# Patient Record
Sex: Female | Born: 1950
Health system: Southern US, Community
[De-identification: ages and names within clinical notes are randomized; demographics above are authoritative.]

## PROBLEM LIST (undated history)

## (undated) DIAGNOSIS — D509 Iron deficiency anemia, unspecified: Secondary | ICD-10-CM

## (undated) DIAGNOSIS — I10 Essential (primary) hypertension: Secondary | ICD-10-CM

## (undated) DIAGNOSIS — E669 Obesity, unspecified: Secondary | ICD-10-CM

## (undated) DIAGNOSIS — E1169 Type 2 diabetes mellitus with other specified complication: Secondary | ICD-10-CM

## (undated) DIAGNOSIS — I2510 Atherosclerotic heart disease of native coronary artery without angina pectoris: Secondary | ICD-10-CM

## (undated) DIAGNOSIS — Z87891 Personal history of nicotine dependence: Secondary | ICD-10-CM

## (undated) DIAGNOSIS — E785 Hyperlipidemia, unspecified: Secondary | ICD-10-CM

## (undated) HISTORY — DX: Iron deficiency anemia, unspecified: D50.9

## (undated) HISTORY — DX: Hyperlipidemia, unspecified: E78.5

## (undated) HISTORY — DX: Atherosclerotic heart disease of native coronary artery without angina pectoris: I25.10

## (undated) HISTORY — DX: Personal history of nicotine dependence: Z87.891

## (undated) HISTORY — DX: Obesity, unspecified: E66.9

## (undated) HISTORY — DX: Obesity, unspecified: E11.69

---

## 1998-04-03 ENCOUNTER — Emergency Department (HOSPITAL_COMMUNITY): Admission: EM | Admit: 1998-04-03 | Discharge: 1998-04-03 | Payer: Self-pay | Admitting: Emergency Medicine

## 1999-02-22 ENCOUNTER — Emergency Department (HOSPITAL_COMMUNITY): Admission: EM | Admit: 1999-02-22 | Discharge: 1999-02-22 | Payer: Self-pay | Admitting: Internal Medicine

## 1999-04-22 ENCOUNTER — Ambulatory Visit (HOSPITAL_COMMUNITY): Admission: RE | Admit: 1999-04-22 | Discharge: 1999-04-22 | Payer: Self-pay | Admitting: *Deleted

## 1999-06-30 ENCOUNTER — Emergency Department (HOSPITAL_COMMUNITY): Admission: EM | Admit: 1999-06-30 | Discharge: 1999-07-01 | Payer: Self-pay | Admitting: Emergency Medicine

## 1999-07-19 ENCOUNTER — Ambulatory Visit (HOSPITAL_COMMUNITY): Admission: RE | Admit: 1999-07-19 | Discharge: 1999-07-19 | Payer: Self-pay | Admitting: Gastroenterology

## 1999-07-19 ENCOUNTER — Encounter (INDEPENDENT_AMBULATORY_CARE_PROVIDER_SITE_OTHER): Payer: Self-pay | Admitting: Specialist

## 2000-03-19 ENCOUNTER — Emergency Department (HOSPITAL_COMMUNITY): Admission: EM | Admit: 2000-03-19 | Discharge: 2000-03-19 | Payer: Self-pay | Admitting: Emergency Medicine

## 2000-03-19 ENCOUNTER — Encounter: Payer: Self-pay | Admitting: Emergency Medicine

## 2000-12-07 ENCOUNTER — Other Ambulatory Visit: Admission: RE | Admit: 2000-12-07 | Discharge: 2000-12-07 | Payer: Self-pay | Admitting: Obstetrics and Gynecology

## 2002-01-09 ENCOUNTER — Other Ambulatory Visit: Admission: RE | Admit: 2002-01-09 | Discharge: 2002-01-09 | Payer: Self-pay | Admitting: Obstetrics and Gynecology

## 2003-03-25 ENCOUNTER — Other Ambulatory Visit: Admission: RE | Admit: 2003-03-25 | Discharge: 2003-03-25 | Payer: Self-pay | Admitting: Obstetrics and Gynecology

## 2004-08-24 ENCOUNTER — Other Ambulatory Visit: Admission: RE | Admit: 2004-08-24 | Discharge: 2004-08-24 | Payer: Self-pay | Admitting: Obstetrics and Gynecology

## 2005-03-18 ENCOUNTER — Ambulatory Visit (HOSPITAL_COMMUNITY): Admission: RE | Admit: 2005-03-18 | Discharge: 2005-03-18 | Payer: Self-pay | Admitting: Obstetrics and Gynecology

## 2005-03-18 ENCOUNTER — Encounter (INDEPENDENT_AMBULATORY_CARE_PROVIDER_SITE_OTHER): Payer: Self-pay | Admitting: *Deleted

## 2005-10-06 ENCOUNTER — Encounter: Admission: RE | Admit: 2005-10-06 | Discharge: 2005-10-06 | Payer: Self-pay | Admitting: Family Medicine

## 2006-05-03 ENCOUNTER — Emergency Department (HOSPITAL_COMMUNITY): Admission: EM | Admit: 2006-05-03 | Discharge: 2006-05-03 | Payer: Self-pay | Admitting: Emergency Medicine

## 2009-03-28 ENCOUNTER — Emergency Department (HOSPITAL_COMMUNITY): Admission: EM | Admit: 2009-03-28 | Discharge: 2009-03-28 | Payer: Self-pay | Admitting: Emergency Medicine

## 2010-08-25 LAB — DIFFERENTIAL
Basophils Absolute: 0 10*3/uL (ref 0.0–0.1)
Basophils Relative: 0 % (ref 0–1)
Eosinophils Absolute: 0.1 10*3/uL (ref 0.0–0.7)
Eosinophils Relative: 1 % (ref 0–5)
Lymphocytes Relative: 16 % (ref 12–46)
Lymphs Abs: 1.3 10*3/uL (ref 0.7–4.0)
Monocytes Absolute: 0.3 10*3/uL (ref 0.1–1.0)
Monocytes Relative: 4 % (ref 3–12)
Neutro Abs: 6.5 10*3/uL (ref 1.7–7.7)
Neutrophils Relative %: 79 % — ABNORMAL HIGH (ref 43–77)

## 2010-08-25 LAB — CBC
HCT: 38.3 % (ref 36.0–46.0)
Hemoglobin: 12.2 g/dL (ref 12.0–15.0)
MCHC: 32 g/dL (ref 30.0–36.0)
MCV: 76.1 fL — ABNORMAL LOW (ref 78.0–100.0)
Platelets: 267 10*3/uL (ref 150–400)
RBC: 5.03 MIL/uL (ref 3.87–5.11)
RDW: 15.4 % (ref 11.5–15.5)
WBC: 8.3 10*3/uL (ref 4.0–10.5)

## 2010-08-25 LAB — HEMOCCULT GUIAC POC 1CARD (OFFICE): Fecal Occult Bld: NEGATIVE

## 2010-10-08 NOTE — H&P (Signed)
NAMESANAIYA, Maureen Bishop               ACCOUNT NO.:  [id number]   MEDICAL RECORD NO.:  [id number]          PATIENT TYPE:  AMB   LOCATION:  SDC                           FACILITY:  [facility]   PHYSICIAN:  Janine Limbo, M.D.DATE OF BIRTH:  07-Oct-1950   DATE OF ADMISSION:  03/18/2005  DATE OF DISCHARGE:                                HISTORY & PHYSICAL   HISTORY OF PRESENT ILLNESS:  Maureen Bishop is a 60 year old female, para 2-0-2-  2, who presents for dilatation and curettage.  The patient has a history of  fibroids and post menopausal bleeding.  An endometrial biopsy was attempted  in the office but was unsuccessful because of a stenotic cervix.  She was  found to have benign endocervical glands however.   OBSTETRICAL HISTORY:  1.  The patient had a vaginal delivery at term in 1993.  2.  Vaginal delivery at term in 1971.  3.  She had elective pregnancy terminations in 1988 and again in 1990.   DRUG ALLERGIES:  SULFA MEDICATIONS cause a rash.   PAST MEDICAL HISTORY:  The patient has hypertension and she currently takes  antihypertensives.   REVIEW OF SYSTEMS:  Noncontributory.   FAMILY HISTORY:  The patient's mother has hypertension.  The patient's  mother, father, brothers, and other relatives have diabetes.   PHYSICAL EXAMINATION:  VITAL SIGNS:  Weight is 154 pounds, height is 5 feet.  HEENT:  Within normal limits.  CHEST:  Clear.  HEART:  Regular rate and rhythm.  BREASTS:  Without masses.  ABDOMEN:  Slightly obese but no masses are appreciated.  EXTREMITIES:  Within normal limits.  NEUROLOGIC:  Grossly normal.  PELVIC:  External genitalia is normal.  The vagina is normal.  The cervix  descends one third of the length of the vagina.  The uterus is upper limits  normal size.  Adnexa no masses.  Rectovaginal exam confirms.   ASSESSMENT:  1.  Post menopausal bleeding.  2.  Stenotic cervix.   PLAN:  The patient will undergo hysteroscopy with dilatation and curettage.  She  understands the indications for her surgical procedure, and she accepts  the risk of, but not limited to, anesthetic complications, bleeding,  infections, and possible damage to the surrounding organs.      Janine Limbo, M.D.  Electronically Signed     AVS/MEDQ  D:  03/17/2005  T:  03/17/2005  Job:  [id number]

## 2010-10-08 NOTE — Op Note (Signed)
[name], Maureen Bishop               ACCOUNT NO.:  [id number]   MEDICAL RECORD NO.:  [id number]          PATIENT TYPE:  AMB   LOCATION:  SDC                           FACILITY:  [facility]   PHYSICIAN:  Janine Limbo, M.D.DATE OF BIRTH:  1951/04/30   DATE OF PROCEDURE:  03/18/2005  DATE OF DISCHARGE:                                 OPERATIVE REPORT   PREOPERATIVE DIAGNOSES:  1.  Postmenopausal bleeding.  2.  Stenotic cervix.   POSTOPERATIVE DIAGNOSES:  1.  Postmenopausal bleeding.  2.  Stenotic cervix.   PROCEDURE:  1.  Hysteroscopy.  2.  Dilatation and curettage.   SURGEON:  Dr. Leonard Schwartz.   ANESTHETIC:  Monitored anesthetic control and local 0.5% Marcaine with  epinephrine.   DISPOSITION:  [name] is a 60 year old female, para 2-0-2-2, who presents  with the above-mentioned diagnosis.  The patient understands the indications  for her surgical procedure, and she accepts the risk of, but not limited to,  anesthetic complications, bleeding, infections, and possible damage to the  surrounding organs.   FINDINGS:  The uterus was approximately 8 weeks' size and anterior.  No  adnexal masses were appreciated.  The cervix was noted to be stenotic.  On  hysteroscopy, no lesions were noted within the endometrial cavity.  The  uterus sounded to 10 cm.   PROCEDURE:  The patient was taken to the operating room where she was given  medication through her IV line.  The patient's abdomen, perineum, and vagina  were prepped with multiple layers of Betadine.  The bladder was drained of  urine.  Examination under anesthesia was performed.  The patient was  sterilely draped.  A paracervical block was placed using 10 mL of 0.5%  Marcaine with epinephrine.  An additional 10 mL of 0.5% Marcaine with  epinephrine were injected directly into the cervix.  An endocervical  curettage was then performed.  The uterus was sounded to 10 cm.  The cervix  was gradually dilated.  The  diagnostic hysteroscope was inserted, and the  cavity was carefully inspected.  Pictures were taken.  No lesions were  noted.  The hysteroscope was removed, and the cavity was then sharply  curetted using a small sharp curette.  The cavity was felt to be clean at  the end of our procedure.  The hysteroscope was again inserted, and pictures  were once again taken.  Hemostasis was noted to be adequate.  We felt we  were ready to end our procedure.  All instruments were removed.  The exam  was repeated, and the uterus was noted to be firm.  The patient was awakened  from her anesthetic and taken to the recovery room in stable condition.   FOLLOW-UP INSTRUCTIONS:  The patient was given a prescription for Vicodin,  and she will take 1-2 tablets every 4 hours as needed for pain.  She will  return to see Dr. Stefano Gaul in 2-3 weeks for a follow-up examination.  She  was given a copy of the postoperative instruction sheet as prepared by the  [facility] [place] for  patients who have undergone a dilatation  and curettage.  The patient understands that she can call at any time for  questions or concerns.      Janine Limbo, M.D.  Electronically Signed     AVS/MEDQ  D:  03/18/2005  T:  03/18/2005  Job:  [id number]

## 2011-01-10 ENCOUNTER — Ambulatory Visit
Admission: RE | Admit: 2011-01-10 | Discharge: 2011-01-10 | Disposition: A | Discharge status: Home / Self Care | Payer: BC Managed Care – PPO | Source: Ambulatory Visit | Attending: Family Medicine | Admitting: Family Medicine

## 2011-01-10 ENCOUNTER — Other Ambulatory Visit: Payer: Self-pay | Admitting: Family Medicine

## 2011-01-10 DIAGNOSIS — M1250 Traumatic arthropathy, unspecified site: Secondary | ICD-10-CM

## 2011-07-20 ENCOUNTER — Ambulatory Visit: Payer: Self-pay | Admitting: Obstetrics and Gynecology

## 2011-08-18 ENCOUNTER — Ambulatory Visit (INDEPENDENT_AMBULATORY_CARE_PROVIDER_SITE_OTHER): Payer: BC Managed Care – PPO | Admitting: Obstetrics and Gynecology

## 2011-08-18 DIAGNOSIS — N390 Urinary tract infection, site not specified: Secondary | ICD-10-CM

## 2011-08-18 DIAGNOSIS — Z01419 Encounter for gynecological examination (general) (routine) without abnormal findings: Secondary | ICD-10-CM

## 2011-08-22 ENCOUNTER — Encounter (HOSPITAL_COMMUNITY): Payer: Self-pay | Admitting: *Deleted

## 2011-08-22 ENCOUNTER — Emergency Department (HOSPITAL_COMMUNITY)
Admission: EM | Admit: 2011-08-22 | Discharge: 2011-08-22 | Disposition: A | Discharge status: Home / Self Care | Payer: BC Managed Care – PPO | Attending: Emergency Medicine | Admitting: Emergency Medicine

## 2011-08-22 DIAGNOSIS — M549 Dorsalgia, unspecified: Secondary | ICD-10-CM | POA: Insufficient documentation

## 2011-08-22 DIAGNOSIS — I10 Essential (primary) hypertension: Secondary | ICD-10-CM | POA: Insufficient documentation

## 2011-08-22 DIAGNOSIS — N390 Urinary tract infection, site not specified: Secondary | ICD-10-CM | POA: Insufficient documentation

## 2011-08-22 DIAGNOSIS — R109 Unspecified abdominal pain: Secondary | ICD-10-CM | POA: Insufficient documentation

## 2011-08-22 HISTORY — DX: Essential (primary) hypertension: I10

## 2011-08-22 LAB — COMPREHENSIVE METABOLIC PANEL
ALT: 37 U/L — ABNORMAL HIGH (ref 0–35)
Calcium: 9.6 mg/dL (ref 8.4–10.5)
Creatinine, Ser: 0.82 mg/dL (ref 0.50–1.10)
GFR calc Af Amer: 87 mL/min — ABNORMAL LOW (ref >90)
GFR calc non Af Amer: 75 mL/min — ABNORMAL LOW (ref >90)
Glucose, Bld: 106 mg/dL — ABNORMAL HIGH (ref 70–99)
Sodium: 137 mEq/L (ref 135–145)
Total Protein: 7.7 g/dL (ref 6.0–8.3)

## 2011-08-22 LAB — URINALYSIS, ROUTINE W REFLEX MICROSCOPIC
Bilirubin Urine: NEGATIVE
Bilirubin Urine: NEGATIVE
Hgb urine dipstick: NEGATIVE
Ketones, ur: NEGATIVE mg/dL
Nitrite: NEGATIVE
Protein, ur: NEGATIVE mg/dL
Urobilinogen, UA: 0.2 mg/dL (ref 0.0–1.0)
pH: 5 (ref 5.0–8.0)

## 2011-08-22 LAB — CBC
HCT: 36.6 % (ref 36.0–46.0)
MCH: 23.4 pg — ABNORMAL LOW (ref 26.0–34.0)
MCV: 72.6 fL — ABNORMAL LOW (ref 78.0–100.0)
Platelets: 292 10*3/uL (ref 150–400)
RBC: 5.04 MIL/uL (ref 3.87–5.11)
RDW: 15.5 % (ref 11.5–15.5)

## 2011-08-22 LAB — URINE MICROSCOPIC-ADD ON

## 2011-08-22 LAB — DIFFERENTIAL
Eosinophils Absolute: 0.1 10*3/uL (ref 0.0–0.7)
Eosinophils Relative: 1 % (ref 0–5)
Lymphs Abs: 1.9 10*3/uL (ref 0.7–4.0)
Monocytes Absolute: 0.6 10*3/uL (ref 0.1–1.0)

## 2011-08-22 MED ORDER — NITROFURANTOIN MONOHYD MACRO 100 MG PO CAPS: 100.0000 mg | ORAL_CAPSULE | Freq: Two times a day (BID) | ORAL | Status: AC

## 2011-08-22 MED ORDER — HYDROCODONE-ACETAMINOPHEN 5-500 MG PO TABS: 1.0000 | ORAL_TABLET | Freq: Four times a day (QID) | ORAL | Status: AC | PRN

## 2011-08-22 MED ORDER — CYCLOBENZAPRINE HCL 10 MG PO TABS
10.0000 mg | ORAL_TABLET | Freq: Once | ORAL | Status: AC
  Administered 2011-08-22: 10 mg via ORAL
  Filled 2011-08-22: qty 1

## 2011-08-22 MED ORDER — KETOROLAC TROMETHAMINE 60 MG/2ML IM SOLN
60.0000 mg | Freq: Once | INTRAMUSCULAR | Status: AC
  Administered 2011-08-22: 60 mg via INTRAMUSCULAR
  Filled 2011-08-22: qty 2

## 2011-08-22 NOTE — ED Notes (Signed)
Pt indicates having right flank pain, states "I have UTI's quite often"

## 2011-08-22 NOTE — ED Provider Notes (Addendum)
History     CSN: [id number]  Arrival date & time 08/22/11  5621   First MD Initiated Contact with Patient 08/22/11 [phone]      Chief Complaint  Patient presents with  . Back Pain    (Consider location/radiation/quality/duration/timing/severity/associated sxs/prior treatment) Patient is a 62 y.o. female presenting with back pain. The history is provided by the patient.  Back Pain  This is a new problem. The current episode started yesterday. The problem occurs constantly. The problem has been gradually worsening. The pain is associated with no known injury (Exercise multiple times last week which is new and was very busy on Saturday). The pain is present in the lumbar spine. The quality of the pain is described as stabbing and shooting. The pain does not radiate. The pain is at a severity of 8/10. The pain is severe. The symptoms are aggravated by twisting, bending and certain positions. The pain is the same all the time. Stiffness is present all day. Pertinent negatives include no fever, no abdominal pain, no abdominal swelling, no bowel incontinence, no bladder incontinence, no dysuria, no pelvic pain, no leg pain, no paresthesias and no weakness. Treatments tried: Tylenol. The treatment provided no relief.    Past Medical History  Diagnosis Date  . Hypertension     Past Surgical History  Procedure Date  . Cesarean section     No family history on file.  History  Substance Use Topics  . Smoking status: Never Smoker   . Smokeless tobacco: Not on file  . Alcohol Use: 6.0 oz/week    10 Glasses of wine per week    OB History    Grav Para Term Preterm Abortions TAB SAB Ect Mult Living                  Review of Systems  Constitutional: Negative for fever.  Gastrointestinal: Negative for abdominal pain and bowel incontinence.  Genitourinary: Negative for bladder incontinence, dysuria and pelvic pain.  Musculoskeletal: Positive for back pain.  Neurological: Negative for  weakness and paresthesias.  All other systems reviewed and are negative.    Allergies  Sulfa antibiotics  Home Medications   Current Outpatient Rx  Name Route Sig Dispense Refill  . ACETAMINOPHEN 500 MG PO TABS Oral Take 1,000 mg by mouth every 4 (four) hours as needed. pain    . OLMESARTAN-AMLODIPINE-HCTZ 20-5-12.5 MG PO TABS Oral Take 1 tablet by mouth daily.      BP 139/68  Pulse 75  Temp(Src) 98.2 F (36.8 C) (Oral)  Resp 20  Wt 160 lb (72.576 kg)  SpO2 100%  Physical Exam  Nursing note and vitals reviewed. Constitutional: She is oriented to person, place, and time. She appears well-developed and well-nourished. She appears distressed.  HENT:  Head: Normocephalic and atraumatic.  Eyes: EOM are normal. Pupils are equal, round, and reactive to light.  Cardiovascular: Intact distal pulses.   Abdominal: Soft. Bowel sounds are normal. She exhibits no distension. There is no tenderness. There is no rebound, no guarding and no CVA tenderness.  Musculoskeletal: She exhibits no tenderness.       Lumbar back: She exhibits decreased range of motion, tenderness and spasm.       Back:       No edema  Neurological: She is alert and oriented to person, place, and time. No cranial nerve deficit.  Skin: Skin is warm and dry. No rash noted.  Psychiatric: She has a normal mood and affect. Her behavior is  normal.    ED Course  Procedures (including critical care time)  Labs Reviewed  URINALYSIS, ROUTINE W REFLEX MICROSCOPIC - Abnormal; Notable for the following:    APPearance CLOUDY (*)    Leukocytes, UA MODERATE (*)    All other components within normal limits  GLUCOSE, CAPILLARY - Abnormal; Notable for the following:    Glucose-Capillary 115 (*)    All other components within normal limits  URINE MICROSCOPIC-ADD ON - Abnormal; Notable for the following:    Squamous Epithelial / LPF MANY (*)    Bacteria, UA MANY (*)    All other components within normal limits  URINALYSIS,  ROUTINE W REFLEX MICROSCOPIC - Abnormal; Notable for the following:    APPearance CLOUDY (*)    Hgb urine dipstick TRACE (*)    Leukocytes, UA MODERATE (*)    All other components within normal limits  CBC - Abnormal; Notable for the following:    Hemoglobin 11.8 (*)    MCV 72.6 (*)    MCH 23.4 (*)    All other components within normal limits  COMPREHENSIVE METABOLIC PANEL - Abnormal; Notable for the following:    Glucose, Bld 106 (*)    ALT 37 (*)    GFR calc non Af Amer 75 (*)    GFR calc Af Amer 87 (*)    All other components within normal limits  URINE MICROSCOPIC-ADD ON - Abnormal; Notable for the following:    Squamous Epithelial / LPF FEW (*)    Bacteria, UA FEW (*)    All other components within normal limits  DIFFERENTIAL   No results found.   1. UTI (lower urinary tract infection)   2. Flank pain       MDM   Patient with back pain most suggestive of right lumbar spasm. She states last week she had done multiple days of exercise and had been very busy and yesterday started feeling spasm on the right side of her back. It is worse when she bends, twists and moves. It is reproducible with palpation on the right side but has no symptoms suggestive of a radicular component.  She denies urinary incontinence, frequency, urgency, dysuria, nausea, vomiting, fever. She states she gets 2-3 urinary tract infections a year her last one was 3 months ago. She denies any other medical problems other than hypertension but states she does have a strong family history of diabetes.  Feel symptoms are all musculoskeletal in nature and patient is having muscle spasms. Doubt pyelonephritis at this time given her symptoms and physical exam however will check a UA and a fingerstick blood sugar.  No symptoms suggestive of kidney stone.   10:02 AM Patient with borderline blood sugar of 1:15 the patient states she has been fasting. UA appears contaminated with many epithelial cells however there  is also many bacteria 21-50 white blood cells and moderate leukocytes. Will attempt to get a cleaner sample.  11:01 AM Labs within normal limits. Repeat urine shows signs of infection and culture was sent. Antibiotics given and pain control given.  Gwyneth Sprout, MD 08/22/11 1105  Gwyneth Sprout, MD 08/22/11 1108  Gwyneth Sprout, MD 08/22/11 1108

## 2012-03-20 ENCOUNTER — Ambulatory Visit
Admission: RE | Admit: 2012-03-20 | Discharge: 2012-03-20 | Disposition: A | Discharge status: Home / Self Care | Payer: BC Managed Care – PPO | Source: Ambulatory Visit | Attending: Family Medicine | Admitting: Family Medicine

## 2012-03-20 ENCOUNTER — Other Ambulatory Visit: Payer: Self-pay | Admitting: Family Medicine

## 2012-03-20 DIAGNOSIS — M7730 Calcaneal spur, unspecified foot: Secondary | ICD-10-CM

## 2012-05-31 ENCOUNTER — Telehealth: Payer: Self-pay | Admitting: Obstetrics and Gynecology

## 2012-05-31 NOTE — Telephone Encounter (Signed)
Spoke with pt rgd msg pt c/o lump in right breast and tender for 3 weeks pt has appt 06/07/12 at 3;00 with avs pt voice understanding

## 2012-06-07 ENCOUNTER — Ambulatory Visit: Payer: BC Managed Care – PPO | Admitting: Obstetrics and Gynecology

## 2012-06-07 ENCOUNTER — Encounter: Payer: Self-pay | Admitting: Obstetrics and Gynecology

## 2012-06-07 VITALS — BP 118/60 | Temp 98.0°F | Wt 164.0 lb

## 2012-06-07 DIAGNOSIS — N63 Unspecified lump in unspecified breast: Secondary | ICD-10-CM

## 2012-06-07 NOTE — Progress Notes (Signed)
HISTORY OF PRESENT ILLNESS  [name] is a 62 y.o. year old female,G2P2, who presents for a problem visit. The patient had a normal mammogram in March of 2013.  She had fibroglandular densities.  Her sister has breast cancer.  Her first period was at age 18.  Her first child was born at age 34.  She is a former cigarette smoker.  She denies estrogen replacement therapy.  Subjective:  The patient complains of a tender lump in her right breast for 2 months.  She denies breast discharge and bleeding.  Objective:  BP 118/60  Temp 98 F (36.7 C) (Oral)  Wt 164 lb (74.39 kg)   breast exam: Tenderness in the right upper area just [place] of the nipple.  No masses appreciated.  No discharge or skin changes present. Chest: Clear Heart: Regular rate and rhythm No adenopathy present.  Exam deferred.  Assessment:  Right breast pain. Sister with breast cancer.  Plan:  Mammogram.  Return to office prn if symptoms worsen or fail to improve.  Return in 6-7 weeks for repeat exam and for annual exam.   Leonard Schwartz M.D.  06/07/2012 3:53 PM   Pt c/o lump in right breast x 2 months.

## 2012-06-22 ENCOUNTER — Other Ambulatory Visit: Payer: Self-pay

## 2012-06-22 DIAGNOSIS — N644 Mastodynia: Secondary | ICD-10-CM

## 2012-11-01 ENCOUNTER — Emergency Department (HOSPITAL_COMMUNITY)
Admission: EM | Admit: 2012-11-01 | Discharge: 2012-11-01 | Disposition: A | Discharge status: Home / Self Care | Payer: BC Managed Care – PPO | Attending: Emergency Medicine | Admitting: Emergency Medicine

## 2012-11-01 ENCOUNTER — Encounter (HOSPITAL_COMMUNITY): Payer: Self-pay | Admitting: Emergency Medicine

## 2012-11-01 DIAGNOSIS — K625 Hemorrhage of anus and rectum: Secondary | ICD-10-CM | POA: Insufficient documentation

## 2012-11-01 DIAGNOSIS — K602 Anal fissure, unspecified: Secondary | ICD-10-CM | POA: Insufficient documentation

## 2012-11-01 DIAGNOSIS — I10 Essential (primary) hypertension: Secondary | ICD-10-CM | POA: Insufficient documentation

## 2012-11-01 DIAGNOSIS — Z792 Long term (current) use of antibiotics: Secondary | ICD-10-CM | POA: Insufficient documentation

## 2012-11-01 DIAGNOSIS — K921 Melena: Secondary | ICD-10-CM | POA: Insufficient documentation

## 2012-11-01 DIAGNOSIS — K649 Unspecified hemorrhoids: Secondary | ICD-10-CM | POA: Insufficient documentation

## 2012-11-01 LAB — SAMPLE TO BLOOD BANK

## 2012-11-01 LAB — CBC WITH DIFFERENTIAL/PLATELET
Basophils Relative: 0 % (ref 0–1)
Eosinophils Absolute: 0.1 10*3/uL (ref 0.0–0.7)
Eosinophils Relative: 1 % (ref 0–5)
MCH: 23.5 pg — ABNORMAL LOW (ref 26.0–34.0)
MCHC: 32.8 g/dL (ref 30.0–36.0)
MCV: 71.7 fL — ABNORMAL LOW (ref 78.0–100.0)
Neutrophils Relative %: 60 % (ref 43–77)
Platelets: 282 10*3/uL (ref 150–400)
RDW: 15.3 % (ref 11.5–15.5)

## 2012-11-01 LAB — COMPREHENSIVE METABOLIC PANEL
ALT: 26 U/L (ref 0–35)
Albumin: 3.9 g/dL (ref 3.5–5.2)
Alkaline Phosphatase: 80 U/L (ref 39–117)
Calcium: 9.3 mg/dL (ref 8.4–10.5)
GFR calc Af Amer: >90 mL/min (ref >90)
Potassium: 3.6 mEq/L (ref 3.5–5.1)
Sodium: 138 mEq/L (ref 135–145)
Total Protein: 8 g/dL (ref 6.0–8.3)

## 2012-11-01 LAB — PROTIME-INR: INR: 1 (ref 0.00–1.49)

## 2012-11-01 LAB — APTT: aPTT: 31 seconds (ref 24–37)

## 2012-11-01 NOTE — ED Notes (Signed)
Pt c/o rectal bleeding x 3 days with BRB; pt denies hx of same

## 2012-11-01 NOTE — ED Notes (Signed)
Pt reports bright red blood in toilet, hx of hemorrhoids and blood in stool but states this is the first time she has noticed blood in stool and toilet for three consecutive days.

## 2012-11-01 NOTE — ED Provider Notes (Signed)
History     CSN: [id number]  Arrival date & time 11/01/12  1222   First MD Initiated Contact with Patient 11/01/12 1612      Chief Complaint  Patient presents with  . Rectal Bleeding    (Consider location/radiation/quality/duration/timing/severity/associated sxs/prior treatment) Patient is a 62 y.o. female presenting with hematochezia.  Rectal Bleeding  Pt with no significant PMH reports 3 days of bright red blood per rectum, blood on normal stool. No melena or maroon blood. No pain with defecation. She has had similar before with no clear source of bleeding identified on colonoscopy about 2-3 years ago. She denies any abdominal pain, fever. No lightheadedness or dizziness.   Past Medical History  Diagnosis Date  . Hypertension     Past Surgical History  Procedure Laterality Date  . Cesarean section      History reviewed. No pertinent family history.  History  Substance Use Topics  . Smoking status: Never Smoker   . Smokeless tobacco: Not on file  . Alcohol Use: 6.0 oz/week    10 Glasses of wine per week    OB History   Grav Para Term Preterm Abortions TAB SAB Ect Mult Living   2 2        2       Review of Systems  Gastrointestinal: Positive for hematochezia.   All other systems reviewed and are negative except as noted in HPI.   Allergies  Sulfa antibiotics  Home Medications   Current Outpatient Rx  Name  Route  Sig  Dispense  Refill  . acetaminophen (TYLENOL) 500 MG tablet   Oral   Take 1,000 mg by mouth every 4 (four) hours as needed. pain         . Olmesartan-Amlodipine-HCTZ (TRIBENZOR) 20-5-12.5 MG TABS   Oral   Take 1 tablet by mouth daily.           BP 131/74  Pulse 69  Temp(Src) 98.3 F (36.8 C) (Oral)  Resp 18  SpO2 98%  Physical Exam  Nursing note and vitals reviewed. Constitutional: She is oriented to person, place, and time. She appears well-developed and well-nourished.  HENT:  Head: Normocephalic and atraumatic.  Eyes:  EOM are normal. Pupils are equal, round, and reactive to light.  Neck: Normal range of motion. Neck supple.  Cardiovascular: Normal rate, normal heart sounds and intact distal pulses.   Pulmonary/Chest: Effort normal and breath sounds normal.  Abdominal: Bowel sounds are normal. She exhibits no distension. There is no tenderness.  Genitourinary:  Several small hemorrhoids and large anal fissue, no stool in rectum, no active bleeding, no masses internally  Musculoskeletal: Normal range of motion. She exhibits no edema and no tenderness.  Neurological: She is alert and oriented to person, place, and time. She has normal strength. No cranial nerve deficit or sensory deficit.  Skin: Skin is warm and dry. No rash noted.  Psychiatric: She has a normal mood and affect.    ED Course  Procedures (including critical care time)  Labs Reviewed  CBC WITH DIFFERENTIAL - Abnormal; Notable for the following:    RBC 5.27 (*)    MCV 71.7 (*)    MCH 23.5 (*)    All other components within normal limits  COMPREHENSIVE METABOLIC PANEL - Abnormal; Notable for the following:    Glucose, Bld 100 (*)    GFR calc non Af Amer 88 (*)    All other components within normal limits  APTT  PROTIME-INR  SAMPLE TO BLOOD  BANK   No results found.   1. Rectal bleeding   2. Anal fissure   3. Hemorrhoid       MDM  Hgb is normal range. Hemodynamically stable. No stool in rectal vault to check for heme. Suspect bleeding is from hemorrhoids/fissure. No indication for admission or transfusion at this point. Advised close PCP followup. Given information for sitz baths, topical treatment etc. Advised to return for worsening bleeding, pain, fever, lightheadedness or for any other concerns.         Charles B. Bernette Mayers, MD 11/01/12 1649

## 2013-01-12 ENCOUNTER — Emergency Department (HOSPITAL_COMMUNITY): Payer: BC Managed Care – PPO

## 2013-01-12 ENCOUNTER — Emergency Department (HOSPITAL_COMMUNITY)
Admission: EM | Admit: 2013-01-12 | Discharge: 2013-01-12 | Disposition: A | Discharge status: Home / Self Care | Payer: BC Managed Care – PPO | Attending: Emergency Medicine | Admitting: Emergency Medicine

## 2013-01-12 ENCOUNTER — Encounter (HOSPITAL_COMMUNITY): Payer: Self-pay | Admitting: Emergency Medicine

## 2013-01-12 DIAGNOSIS — R0989 Other specified symptoms and signs involving the circulatory and respiratory systems: Secondary | ICD-10-CM | POA: Insufficient documentation

## 2013-01-12 DIAGNOSIS — R0602 Shortness of breath: Secondary | ICD-10-CM | POA: Insufficient documentation

## 2013-01-12 DIAGNOSIS — R071 Chest pain on breathing: Secondary | ICD-10-CM | POA: Insufficient documentation

## 2013-01-12 DIAGNOSIS — R0609 Other forms of dyspnea: Secondary | ICD-10-CM | POA: Insufficient documentation

## 2013-01-12 DIAGNOSIS — I10 Essential (primary) hypertension: Secondary | ICD-10-CM | POA: Insufficient documentation

## 2013-01-12 DIAGNOSIS — R0600 Dyspnea, unspecified: Secondary | ICD-10-CM

## 2013-01-12 DIAGNOSIS — Z79899 Other long term (current) drug therapy: Secondary | ICD-10-CM | POA: Insufficient documentation

## 2013-01-12 DIAGNOSIS — R079 Chest pain, unspecified: Secondary | ICD-10-CM

## 2013-01-12 LAB — CBC
Hemoglobin: 12.4 g/dL (ref 12.0–15.0)
MCH: 24.3 pg — ABNORMAL LOW (ref 26.0–34.0)
MCHC: 33.1 g/dL (ref 30.0–36.0)
Platelets: 264 10*3/uL (ref 150–400)
RBC: 5.11 MIL/uL (ref 3.87–5.11)

## 2013-01-12 MED ORDER — OXYCODONE-ACETAMINOPHEN 5-325 MG PO TABS: 1.0000 | ORAL_TABLET | Freq: Four times a day (QID) | ORAL | Status: DC | PRN

## 2013-01-12 NOTE — ED Provider Notes (Signed)
CSN: [id number]     Arrival date & time 01/12/13  1453 History     First MD Initiated Contact with Patient 01/12/13 1502     Chief Complaint  Patient presents with  . Shortness of Breath  . Chest Pain   (Consider location/radiation/quality/duration/timing/severity/associated sxs/prior Treatment) Patient is a 62 y.o. female presenting with shortness of breath and chest pain. The history is provided by the patient.  Shortness of Breath Severity:  Moderate Associated symptoms: chest pain   Associated symptoms: no abdominal pain, no headaches, no rash and no vomiting   Chest Pain Associated symptoms: shortness of breath   Associated symptoms: no abdominal pain, no back pain, no headache, no nausea, no numbness, not vomiting and no weakness    patient has had some chest pain since she helped a relative move last weekend. It is dull. Is worse with movement. No fevers. No cough. She also states she's had some mild increasing shortness of breath the last couple days. No swelling or legs. She does not currently smoke but was a smoker but she was younger. The pain does not come on with exertion. She has hypertension but no known coronary artery disease. Pain is worse with certain movements.  Past Medical History  Diagnosis Date  . Hypertension    Past Surgical History  Procedure Laterality Date  . Cesarean section     No family history on file. History  Substance Use Topics  . Smoking status: Never Smoker   . Smokeless tobacco: Not on file  . Alcohol Use: 6.0 oz/week    10 Glasses of wine per week   OB History   Grav Para Term Preterm Abortions TAB SAB Ect Mult Living   2 2        2      Review of Systems  Constitutional: Negative for activity change and appetite change.  HENT: Negative for neck stiffness.   Eyes: Negative for pain.  Respiratory: Positive for shortness of breath. Negative for chest tightness.   Cardiovascular: Positive for chest pain. Negative for leg swelling.   Gastrointestinal: Negative for nausea, vomiting, abdominal pain and diarrhea.  Genitourinary: Negative for flank pain.  Musculoskeletal: Negative for back pain.  Skin: Negative for rash.  Neurological: Negative for weakness, numbness and headaches.  Psychiatric/Behavioral: Negative for behavioral problems.    Allergies  Sulfa antibiotics  Home Medications   Current Outpatient Rx  Name  Route  Sig  Dispense  Refill  . acetaminophen (TYLENOL) 500 MG tablet   Oral   Take 1,000 mg by mouth every 4 (four) hours as needed. pain         . ibuprofen (ADVIL,MOTRIN) 200 MG tablet   Oral   Take 400 mg by mouth every 6 (six) hours as needed for pain.         . Olmesartan-Amlodipine-HCTZ (TRIBENZOR) 20-5-12.5 MG TABS   Oral   Take 1 tablet by mouth daily.          BP 113/64  Pulse 65  Temp(Src) 97.7 F (36.5 C) (Oral)  Resp 14  SpO2 99% Physical Exam  Nursing note and vitals reviewed. Constitutional: She is oriented to person, place, and time. She appears well-developed and well-nourished.  HENT:  Head: Normocephalic and atraumatic.  Eyes: EOM are normal. Pupils are equal, round, and reactive to light.  Neck: Normal range of motion. Neck supple.  Cardiovascular: Normal rate, regular rhythm and normal heart sounds.   No murmur heard. Pulmonary/Chest: Effort normal and breath  sounds normal. No respiratory distress. She has no wheezes. She has no rales. She exhibits tenderness.  Mild tenderness to anterior chest wall. No crepitance or deformity.  Abdominal: Soft. Bowel sounds are normal. She exhibits no distension. There is no tenderness. There is no rebound and no guarding.  Musculoskeletal: Normal range of motion.  Neurological: She is alert and oriented to person, place, and time. No cranial nerve deficit.  Skin: Skin is warm and dry.  Psychiatric: She has a normal mood and affect. Her speech is normal.    ED Course   Procedures (including critical care time)  Labs  Reviewed  CBC - Abnormal; Notable for the following:    MCV 73.4 (*)    MCH 24.3 (*)    All other components within normal limits  D-DIMER, QUANTITATIVE  POCT I-STAT TROPONIN I   No results found. No diagnosis found.  Date: 01/12/2013  Rate: 66  Rhythm: normal sinus rhythm  QRS Axis: normal  Intervals: normal  ST/T Wave abnormalities: normal  Conduction Disutrbances: none  Narrative Interpretation: unremarkable    MDM  Patient with some shortness of breath and chest pain. Has been going for several days. EKG is reassuring enzymes are negative. Negative d-dimer. Maybe musculoskeletal since had recent physical activity. Will be discharged home.  Juliet Rude. Rubin Payor, MD 01/12/13 1901

## 2013-01-12 NOTE — ED Notes (Signed)
Pt. Stated, I took 2 Ibuprofen this  Am at [employer], helped some.

## 2013-01-12 NOTE — ED Notes (Signed)
Pt. Stated, I've been SOB and having pressure on my chest off and on since Thursday.

## 2014-03-24 ENCOUNTER — Encounter (HOSPITAL_COMMUNITY): Payer: Self-pay | Admitting: Emergency Medicine

## 2014-06-30 DIAGNOSIS — F064 Anxiety disorder due to known physiological condition: Secondary | ICD-10-CM | POA: Diagnosis not present

## 2014-06-30 DIAGNOSIS — I10 Essential (primary) hypertension: Secondary | ICD-10-CM | POA: Diagnosis not present

## 2014-06-30 DIAGNOSIS — E0800 Diabetes mellitus due to underlying condition with hyperosmolarity without nonketotic hyperglycemic-hyperosmolar coma (NKHHC): Secondary | ICD-10-CM | POA: Diagnosis not present

## 2014-07-24 DIAGNOSIS — R399 Unspecified symptoms and signs involving the genitourinary system: Secondary | ICD-10-CM | POA: Diagnosis not present

## 2014-07-24 DIAGNOSIS — M545 Low back pain: Secondary | ICD-10-CM | POA: Diagnosis not present

## 2014-07-24 DIAGNOSIS — N819 Female genital prolapse, unspecified: Secondary | ICD-10-CM | POA: Diagnosis not present

## 2014-07-24 DIAGNOSIS — N390 Urinary tract infection, site not specified: Secondary | ICD-10-CM | POA: Diagnosis not present

## 2014-07-24 DIAGNOSIS — R102 Pelvic and perineal pain: Secondary | ICD-10-CM | POA: Diagnosis not present

## 2014-07-24 DIAGNOSIS — N952 Postmenopausal atrophic vaginitis: Secondary | ICD-10-CM | POA: Diagnosis not present

## 2014-09-03 DIAGNOSIS — Z0000 Encounter for general adult medical examination without abnormal findings: Secondary | ICD-10-CM | POA: Diagnosis not present

## 2014-10-02 DIAGNOSIS — R102 Pelvic and perineal pain: Secondary | ICD-10-CM | POA: Diagnosis not present

## 2014-10-02 DIAGNOSIS — R399 Unspecified symptoms and signs involving the genitourinary system: Secondary | ICD-10-CM | POA: Diagnosis not present

## 2014-10-02 DIAGNOSIS — R197 Diarrhea, unspecified: Secondary | ICD-10-CM | POA: Diagnosis not present

## 2014-10-02 DIAGNOSIS — N8189 Other female genital prolapse: Secondary | ICD-10-CM | POA: Diagnosis not present

## 2014-10-22 DIAGNOSIS — Z13820 Encounter for screening for osteoporosis: Secondary | ICD-10-CM | POA: Diagnosis not present

## 2014-10-28 DIAGNOSIS — M545 Low back pain: Secondary | ICD-10-CM | POA: Diagnosis not present

## 2014-10-28 DIAGNOSIS — I10 Essential (primary) hypertension: Secondary | ICD-10-CM | POA: Diagnosis not present

## 2014-10-28 DIAGNOSIS — F430 Acute stress reaction: Secondary | ICD-10-CM | POA: Diagnosis not present

## 2014-10-28 DIAGNOSIS — E0800 Diabetes mellitus due to underlying condition with hyperosmolarity without nonketotic hyperglycemic-hyperosmolar coma (NKHHC): Secondary | ICD-10-CM | POA: Diagnosis not present

## 2014-11-11 DIAGNOSIS — D509 Iron deficiency anemia, unspecified: Secondary | ICD-10-CM | POA: Diagnosis not present

## 2014-11-11 DIAGNOSIS — I10 Essential (primary) hypertension: Secondary | ICD-10-CM | POA: Diagnosis not present

## 2014-11-11 DIAGNOSIS — E0800 Diabetes mellitus due to underlying condition with hyperosmolarity without nonketotic hyperglycemic-hyperosmolar coma (NKHHC): Secondary | ICD-10-CM | POA: Diagnosis not present

## 2014-11-11 DIAGNOSIS — F430 Acute stress reaction: Secondary | ICD-10-CM | POA: Diagnosis not present

## 2014-11-12 DIAGNOSIS — K649 Unspecified hemorrhoids: Secondary | ICD-10-CM | POA: Diagnosis not present

## 2014-11-12 DIAGNOSIS — Z1211 Encounter for screening for malignant neoplasm of colon: Secondary | ICD-10-CM | POA: Diagnosis not present

## 2014-11-12 DIAGNOSIS — Z1231 Encounter for screening mammogram for malignant neoplasm of breast: Secondary | ICD-10-CM | POA: Diagnosis not present

## 2014-11-12 DIAGNOSIS — B009 Herpesviral infection, unspecified: Secondary | ICD-10-CM | POA: Diagnosis not present

## 2014-11-12 DIAGNOSIS — R102 Pelvic and perineal pain: Secondary | ICD-10-CM | POA: Diagnosis not present

## 2014-11-12 DIAGNOSIS — Z01411 Encounter for gynecological examination (general) (routine) with abnormal findings: Secondary | ICD-10-CM | POA: Diagnosis not present

## 2014-11-12 DIAGNOSIS — Z124 Encounter for screening for malignant neoplasm of cervix: Secondary | ICD-10-CM | POA: Diagnosis not present

## 2014-11-12 DIAGNOSIS — Z6831 Body mass index (BMI) 31.0-31.9, adult: Secondary | ICD-10-CM | POA: Diagnosis not present

## 2014-11-21 DIAGNOSIS — R21 Rash and other nonspecific skin eruption: Secondary | ICD-10-CM | POA: Diagnosis not present

## 2014-11-21 DIAGNOSIS — I10 Essential (primary) hypertension: Secondary | ICD-10-CM | POA: Diagnosis not present

## 2015-01-01 DIAGNOSIS — F1020 Alcohol dependence, uncomplicated: Secondary | ICD-10-CM | POA: Diagnosis not present

## 2015-01-01 DIAGNOSIS — R7309 Other abnormal glucose: Secondary | ICD-10-CM | POA: Diagnosis not present

## 2015-01-01 DIAGNOSIS — F064 Anxiety disorder due to known physiological condition: Secondary | ICD-10-CM | POA: Diagnosis not present

## 2015-01-01 DIAGNOSIS — N390 Urinary tract infection, site not specified: Secondary | ICD-10-CM | POA: Diagnosis not present

## 2015-01-08 DIAGNOSIS — E0800 Diabetes mellitus due to underlying condition with hyperosmolarity without nonketotic hyperglycemic-hyperosmolar coma (NKHHC): Secondary | ICD-10-CM | POA: Diagnosis not present

## 2015-01-08 DIAGNOSIS — I10 Essential (primary) hypertension: Secondary | ICD-10-CM | POA: Diagnosis not present

## 2015-01-08 DIAGNOSIS — M545 Low back pain: Secondary | ICD-10-CM | POA: Diagnosis not present

## 2015-03-04 DIAGNOSIS — Z1231 Encounter for screening mammogram for malignant neoplasm of breast: Secondary | ICD-10-CM | POA: Diagnosis not present

## 2015-06-11 DIAGNOSIS — K625 Hemorrhage of anus and rectum: Secondary | ICD-10-CM | POA: Diagnosis not present

## 2015-06-11 DIAGNOSIS — N390 Urinary tract infection, site not specified: Secondary | ICD-10-CM | POA: Diagnosis not present

## 2015-06-11 DIAGNOSIS — K649 Unspecified hemorrhoids: Secondary | ICD-10-CM | POA: Diagnosis not present

## 2015-07-09 DIAGNOSIS — M549 Dorsalgia, unspecified: Secondary | ICD-10-CM | POA: Diagnosis not present

## 2015-07-09 DIAGNOSIS — I10 Essential (primary) hypertension: Secondary | ICD-10-CM | POA: Diagnosis not present

## 2015-07-16 DIAGNOSIS — I10 Essential (primary) hypertension: Secondary | ICD-10-CM | POA: Diagnosis not present

## 2015-07-16 DIAGNOSIS — R7309 Other abnormal glucose: Secondary | ICD-10-CM | POA: Diagnosis not present

## 2015-07-16 DIAGNOSIS — D500 Iron deficiency anemia secondary to blood loss (chronic): Secondary | ICD-10-CM | POA: Diagnosis not present

## 2015-07-16 DIAGNOSIS — Z6828 Body mass index (BMI) 28.0-28.9, adult: Secondary | ICD-10-CM | POA: Diagnosis not present

## 2015-10-25 DIAGNOSIS — H6123 Impacted cerumen, bilateral: Secondary | ICD-10-CM | POA: Diagnosis not present

## 2015-10-25 DIAGNOSIS — H6692 Otitis media, unspecified, left ear: Secondary | ICD-10-CM | POA: Diagnosis not present

## 2015-10-31 DIAGNOSIS — H6692 Otitis media, unspecified, left ear: Secondary | ICD-10-CM | POA: Diagnosis not present

## 2015-10-31 DIAGNOSIS — H6123 Impacted cerumen, bilateral: Secondary | ICD-10-CM | POA: Diagnosis not present

## 2015-11-18 DIAGNOSIS — R7309 Other abnormal glucose: Secondary | ICD-10-CM | POA: Diagnosis not present

## 2015-11-18 DIAGNOSIS — F1020 Alcohol dependence, uncomplicated: Secondary | ICD-10-CM | POA: Diagnosis not present

## 2015-11-18 DIAGNOSIS — I10 Essential (primary) hypertension: Secondary | ICD-10-CM | POA: Diagnosis not present

## 2015-11-18 DIAGNOSIS — F430 Acute stress reaction: Secondary | ICD-10-CM | POA: Diagnosis not present

## 2016-01-21 DIAGNOSIS — F430 Acute stress reaction: Secondary | ICD-10-CM | POA: Diagnosis not present

## 2016-01-21 DIAGNOSIS — I10 Essential (primary) hypertension: Secondary | ICD-10-CM | POA: Diagnosis not present

## 2016-03-17 DIAGNOSIS — R350 Frequency of micturition: Secondary | ICD-10-CM | POA: Diagnosis not present

## 2016-03-17 DIAGNOSIS — N390 Urinary tract infection, site not specified: Secondary | ICD-10-CM | POA: Diagnosis not present

## 2016-03-22 ENCOUNTER — Other Ambulatory Visit: Payer: Self-pay | Admitting: Gastroenterology

## 2016-03-22 DIAGNOSIS — R1900 Intra-abdominal and pelvic swelling, mass and lump, unspecified site: Secondary | ICD-10-CM

## 2016-03-22 DIAGNOSIS — K625 Hemorrhage of anus and rectum: Secondary | ICD-10-CM | POA: Diagnosis not present

## 2016-03-24 DIAGNOSIS — E782 Mixed hyperlipidemia: Secondary | ICD-10-CM | POA: Diagnosis not present

## 2016-03-24 DIAGNOSIS — I10 Essential (primary) hypertension: Secondary | ICD-10-CM | POA: Diagnosis not present

## 2016-03-24 DIAGNOSIS — F430 Acute stress reaction: Secondary | ICD-10-CM | POA: Diagnosis not present

## 2016-03-24 DIAGNOSIS — R7309 Other abnormal glucose: Secondary | ICD-10-CM | POA: Diagnosis not present

## 2016-03-31 ENCOUNTER — Ambulatory Visit
Admission: RE | Admit: 2016-03-31 | Discharge: 2016-03-31 | Disposition: A | Discharge status: Home / Self Care | Payer: Commercial Managed Care - HMO | Source: Ambulatory Visit | Attending: Gastroenterology | Admitting: Gastroenterology

## 2016-03-31 DIAGNOSIS — R110 Nausea: Secondary | ICD-10-CM | POA: Diagnosis not present

## 2016-03-31 DIAGNOSIS — R1900 Intra-abdominal and pelvic swelling, mass and lump, unspecified site: Secondary | ICD-10-CM

## 2016-04-26 DIAGNOSIS — Z1231 Encounter for screening mammogram for malignant neoplasm of breast: Secondary | ICD-10-CM | POA: Diagnosis not present

## 2016-04-26 DIAGNOSIS — Z6831 Body mass index (BMI) 31.0-31.9, adult: Secondary | ICD-10-CM | POA: Diagnosis not present

## 2016-04-26 DIAGNOSIS — Z124 Encounter for screening for malignant neoplasm of cervix: Secondary | ICD-10-CM | POA: Diagnosis not present

## 2016-04-26 DIAGNOSIS — Z01411 Encounter for gynecological examination (general) (routine) with abnormal findings: Secondary | ICD-10-CM | POA: Diagnosis not present

## 2016-04-26 DIAGNOSIS — A6000 Herpesviral infection of urogenital system, unspecified: Secondary | ICD-10-CM | POA: Diagnosis not present

## 2016-05-09 DIAGNOSIS — D126 Benign neoplasm of colon, unspecified: Secondary | ICD-10-CM | POA: Diagnosis not present

## 2016-05-09 DIAGNOSIS — K625 Hemorrhage of anus and rectum: Secondary | ICD-10-CM | POA: Diagnosis not present

## 2016-05-09 DIAGNOSIS — D123 Benign neoplasm of transverse colon: Secondary | ICD-10-CM | POA: Diagnosis not present

## 2016-05-09 DIAGNOSIS — K5730 Diverticulosis of large intestine without perforation or abscess without bleeding: Secondary | ICD-10-CM | POA: Diagnosis not present

## 2016-05-09 DIAGNOSIS — K648 Other hemorrhoids: Secondary | ICD-10-CM | POA: Diagnosis not present

## 2016-05-12 DIAGNOSIS — D126 Benign neoplasm of colon, unspecified: Secondary | ICD-10-CM | POA: Diagnosis not present

## 2016-06-20 DIAGNOSIS — K700 Alcoholic fatty liver: Secondary | ICD-10-CM | POA: Diagnosis not present

## 2016-06-20 DIAGNOSIS — D500 Iron deficiency anemia secondary to blood loss (chronic): Secondary | ICD-10-CM | POA: Diagnosis not present

## 2016-06-20 DIAGNOSIS — I10 Essential (primary) hypertension: Secondary | ICD-10-CM | POA: Diagnosis not present

## 2016-06-20 DIAGNOSIS — E1140 Type 2 diabetes mellitus with diabetic neuropathy, unspecified: Secondary | ICD-10-CM | POA: Diagnosis not present

## 2016-06-20 DIAGNOSIS — F1020 Alcohol dependence, uncomplicated: Secondary | ICD-10-CM | POA: Diagnosis not present

## 2016-06-20 DIAGNOSIS — Z23 Encounter for immunization: Secondary | ICD-10-CM | POA: Diagnosis not present

## 2016-06-22 DIAGNOSIS — L308 Other specified dermatitis: Secondary | ICD-10-CM | POA: Diagnosis not present

## 2016-07-04 DIAGNOSIS — R69 Illness, unspecified: Secondary | ICD-10-CM | POA: Diagnosis not present

## 2016-09-19 DIAGNOSIS — I10 Essential (primary) hypertension: Secondary | ICD-10-CM | POA: Diagnosis not present

## 2016-09-19 DIAGNOSIS — E782 Mixed hyperlipidemia: Secondary | ICD-10-CM | POA: Diagnosis not present

## 2016-09-19 DIAGNOSIS — M545 Low back pain: Secondary | ICD-10-CM | POA: Diagnosis not present

## 2016-09-19 DIAGNOSIS — E1140 Type 2 diabetes mellitus with diabetic neuropathy, unspecified: Secondary | ICD-10-CM | POA: Diagnosis not present

## 2016-09-19 DIAGNOSIS — F0150 Vascular dementia without behavioral disturbance: Secondary | ICD-10-CM | POA: Diagnosis not present

## 2016-12-21 DIAGNOSIS — J311 Chronic nasopharyngitis: Secondary | ICD-10-CM | POA: Diagnosis not present

## 2017-01-19 DIAGNOSIS — J343 Hypertrophy of nasal turbinates: Secondary | ICD-10-CM | POA: Diagnosis not present

## 2017-01-19 DIAGNOSIS — J324 Chronic pansinusitis: Secondary | ICD-10-CM | POA: Diagnosis not present

## 2017-01-19 DIAGNOSIS — K219 Gastro-esophageal reflux disease without esophagitis: Secondary | ICD-10-CM | POA: Diagnosis not present

## 2017-01-19 DIAGNOSIS — J3089 Other allergic rhinitis: Secondary | ICD-10-CM | POA: Diagnosis not present

## 2017-01-19 DIAGNOSIS — H903 Sensorineural hearing loss, bilateral: Secondary | ICD-10-CM | POA: Diagnosis not present

## 2017-01-19 DIAGNOSIS — R1314 Dysphagia, pharyngoesophageal phase: Secondary | ICD-10-CM | POA: Diagnosis not present

## 2017-02-09 DIAGNOSIS — J3089 Other allergic rhinitis: Secondary | ICD-10-CM | POA: Diagnosis not present

## 2017-02-09 DIAGNOSIS — J324 Chronic pansinusitis: Secondary | ICD-10-CM | POA: Diagnosis not present

## 2017-02-26 DIAGNOSIS — N390 Urinary tract infection, site not specified: Secondary | ICD-10-CM | POA: Diagnosis not present

## 2017-02-26 DIAGNOSIS — M62838 Other muscle spasm: Secondary | ICD-10-CM | POA: Diagnosis not present

## 2017-02-27 DIAGNOSIS — H538 Other visual disturbances: Secondary | ICD-10-CM | POA: Diagnosis not present

## 2017-02-27 DIAGNOSIS — H2513 Age-related nuclear cataract, bilateral: Secondary | ICD-10-CM | POA: Diagnosis not present

## 2017-03-02 DIAGNOSIS — J329 Chronic sinusitis, unspecified: Secondary | ICD-10-CM | POA: Diagnosis not present

## 2017-03-14 DIAGNOSIS — I10 Essential (primary) hypertension: Secondary | ICD-10-CM | POA: Diagnosis not present

## 2017-04-20 DIAGNOSIS — I10 Essential (primary) hypertension: Secondary | ICD-10-CM | POA: Diagnosis not present

## 2017-04-20 DIAGNOSIS — E118 Type 2 diabetes mellitus with unspecified complications: Secondary | ICD-10-CM | POA: Diagnosis not present

## 2017-04-20 DIAGNOSIS — K625 Hemorrhage of anus and rectum: Secondary | ICD-10-CM | POA: Diagnosis not present

## 2017-04-20 DIAGNOSIS — F1020 Alcohol dependence, uncomplicated: Secondary | ICD-10-CM | POA: Diagnosis not present

## 2017-04-21 DIAGNOSIS — I10 Essential (primary) hypertension: Secondary | ICD-10-CM | POA: Diagnosis not present

## 2017-04-21 DIAGNOSIS — F1020 Alcohol dependence, uncomplicated: Secondary | ICD-10-CM | POA: Diagnosis not present

## 2017-04-21 DIAGNOSIS — K625 Hemorrhage of anus and rectum: Secondary | ICD-10-CM | POA: Diagnosis not present

## 2017-04-21 DIAGNOSIS — Z0000 Encounter for general adult medical examination without abnormal findings: Secondary | ICD-10-CM | POA: Diagnosis not present

## 2017-04-21 DIAGNOSIS — E1140 Type 2 diabetes mellitus with diabetic neuropathy, unspecified: Secondary | ICD-10-CM | POA: Diagnosis not present

## 2017-05-09 DIAGNOSIS — A6000 Herpesviral infection of urogenital system, unspecified: Secondary | ICD-10-CM | POA: Diagnosis not present

## 2017-05-09 DIAGNOSIS — Z6832 Body mass index (BMI) 32.0-32.9, adult: Secondary | ICD-10-CM | POA: Diagnosis not present

## 2017-05-09 DIAGNOSIS — Z01411 Encounter for gynecological examination (general) (routine) with abnormal findings: Secondary | ICD-10-CM | POA: Diagnosis not present

## 2017-05-09 DIAGNOSIS — R300 Dysuria: Secondary | ICD-10-CM | POA: Diagnosis not present

## 2017-05-09 DIAGNOSIS — Z1231 Encounter for screening mammogram for malignant neoplasm of breast: Secondary | ICD-10-CM | POA: Diagnosis not present

## 2017-05-09 DIAGNOSIS — K644 Residual hemorrhoidal skin tags: Secondary | ICD-10-CM | POA: Diagnosis not present

## 2017-05-09 DIAGNOSIS — Z124 Encounter for screening for malignant neoplasm of cervix: Secondary | ICD-10-CM | POA: Diagnosis not present

## 2017-06-05 ENCOUNTER — Other Ambulatory Visit: Payer: Self-pay | Admitting: Pharmacy Technician

## 2017-06-05 NOTE — Patient Outreach (Signed)
[facility]) Care Management  06/05/2017  Maureen Bishop November 18, 1950 [id number]   Unsuccessful outreach call to [name]. Called in reference to medication Janumet XR, to inform patient that there is a patient assistance program available for it.  Maud Deed Bethany, [place] [facility] [phone]

## 2017-06-07 DIAGNOSIS — N8110 Cystocele, unspecified: Secondary | ICD-10-CM | POA: Diagnosis not present

## 2017-06-07 DIAGNOSIS — M6281 Muscle weakness (generalized): Secondary | ICD-10-CM | POA: Diagnosis not present

## 2017-06-09 DIAGNOSIS — J324 Chronic pansinusitis: Secondary | ICD-10-CM | POA: Diagnosis not present

## 2017-06-14 DIAGNOSIS — N810 Urethrocele: Secondary | ICD-10-CM | POA: Diagnosis not present

## 2017-06-14 DIAGNOSIS — M6281 Muscle weakness (generalized): Secondary | ICD-10-CM | POA: Diagnosis not present

## 2017-06-19 DIAGNOSIS — J329 Chronic sinusitis, unspecified: Secondary | ICD-10-CM | POA: Diagnosis not present

## 2017-06-21 DIAGNOSIS — M6281 Muscle weakness (generalized): Secondary | ICD-10-CM | POA: Diagnosis not present

## 2017-06-21 DIAGNOSIS — R351 Nocturia: Secondary | ICD-10-CM | POA: Diagnosis not present

## 2017-06-21 DIAGNOSIS — N8110 Cystocele, unspecified: Secondary | ICD-10-CM | POA: Diagnosis not present

## 2017-07-05 DIAGNOSIS — N8110 Cystocele, unspecified: Secondary | ICD-10-CM | POA: Diagnosis not present

## 2017-07-05 DIAGNOSIS — M6281 Muscle weakness (generalized): Secondary | ICD-10-CM | POA: Diagnosis not present

## 2017-07-05 DIAGNOSIS — R351 Nocturia: Secondary | ICD-10-CM | POA: Diagnosis not present

## 2017-07-12 DIAGNOSIS — M6281 Muscle weakness (generalized): Secondary | ICD-10-CM | POA: Diagnosis not present

## 2017-07-12 DIAGNOSIS — N8110 Cystocele, unspecified: Secondary | ICD-10-CM | POA: Diagnosis not present

## 2017-07-12 DIAGNOSIS — R351 Nocturia: Secondary | ICD-10-CM | POA: Diagnosis not present

## 2017-07-18 DIAGNOSIS — L308 Other specified dermatitis: Secondary | ICD-10-CM | POA: Diagnosis not present

## 2017-07-19 DIAGNOSIS — R351 Nocturia: Secondary | ICD-10-CM | POA: Diagnosis not present

## 2017-07-19 DIAGNOSIS — N8110 Cystocele, unspecified: Secondary | ICD-10-CM | POA: Diagnosis not present

## 2017-07-21 DIAGNOSIS — K642 Third degree hemorrhoids: Secondary | ICD-10-CM | POA: Diagnosis not present

## 2017-07-21 DIAGNOSIS — E0800 Diabetes mellitus due to underlying condition with hyperosmolarity without nonketotic hyperglycemic-hyperosmolar coma (NKHHC): Secondary | ICD-10-CM | POA: Diagnosis not present

## 2017-07-21 DIAGNOSIS — K700 Alcoholic fatty liver: Secondary | ICD-10-CM | POA: Diagnosis not present

## 2017-07-21 DIAGNOSIS — E1140 Type 2 diabetes mellitus with diabetic neuropathy, unspecified: Secondary | ICD-10-CM | POA: Diagnosis not present

## 2017-07-21 DIAGNOSIS — E782 Mixed hyperlipidemia: Secondary | ICD-10-CM | POA: Diagnosis not present

## 2017-07-21 DIAGNOSIS — E118 Type 2 diabetes mellitus with unspecified complications: Secondary | ICD-10-CM | POA: Diagnosis not present

## 2017-07-21 DIAGNOSIS — I10 Essential (primary) hypertension: Secondary | ICD-10-CM | POA: Diagnosis not present

## 2017-07-26 DIAGNOSIS — R351 Nocturia: Secondary | ICD-10-CM | POA: Diagnosis not present

## 2017-07-26 DIAGNOSIS — M6281 Muscle weakness (generalized): Secondary | ICD-10-CM | POA: Diagnosis not present

## 2017-07-26 DIAGNOSIS — N8110 Cystocele, unspecified: Secondary | ICD-10-CM | POA: Diagnosis not present

## 2017-08-24 DIAGNOSIS — R079 Chest pain, unspecified: Secondary | ICD-10-CM | POA: Diagnosis not present

## 2017-08-24 DIAGNOSIS — K30 Functional dyspepsia: Secondary | ICD-10-CM | POA: Diagnosis not present

## 2017-10-21 DIAGNOSIS — H6121 Impacted cerumen, right ear: Secondary | ICD-10-CM | POA: Diagnosis not present

## 2017-10-21 DIAGNOSIS — R300 Dysuria: Secondary | ICD-10-CM | POA: Diagnosis not present

## 2017-10-25 DIAGNOSIS — F1020 Alcohol dependence, uncomplicated: Secondary | ICD-10-CM | POA: Diagnosis not present

## 2017-10-25 DIAGNOSIS — K700 Alcoholic fatty liver: Secondary | ICD-10-CM | POA: Diagnosis not present

## 2017-10-25 DIAGNOSIS — E118 Type 2 diabetes mellitus with unspecified complications: Secondary | ICD-10-CM | POA: Diagnosis not present

## 2017-10-25 DIAGNOSIS — I10 Essential (primary) hypertension: Secondary | ICD-10-CM | POA: Diagnosis not present

## 2017-10-25 DIAGNOSIS — E782 Mixed hyperlipidemia: Secondary | ICD-10-CM | POA: Diagnosis not present

## 2017-10-25 DIAGNOSIS — R10813 Right lower quadrant abdominal tenderness: Secondary | ICD-10-CM | POA: Diagnosis not present

## 2017-10-25 DIAGNOSIS — R10814 Left lower quadrant abdominal tenderness: Secondary | ICD-10-CM | POA: Diagnosis not present

## 2017-10-27 ENCOUNTER — Other Ambulatory Visit: Payer: Self-pay | Admitting: Family Medicine

## 2017-10-27 ENCOUNTER — Ambulatory Visit
Admission: RE | Admit: 2017-10-27 | Discharge: 2017-10-27 | Disposition: A | Discharge status: Home / Self Care | Payer: Medicare HMO | Source: Ambulatory Visit | Attending: Family Medicine | Admitting: Family Medicine

## 2017-10-27 DIAGNOSIS — R109 Unspecified abdominal pain: Secondary | ICD-10-CM | POA: Diagnosis not present

## 2017-10-27 DIAGNOSIS — R10812 Left upper quadrant abdominal tenderness: Secondary | ICD-10-CM

## 2017-11-09 DIAGNOSIS — D509 Iron deficiency anemia, unspecified: Secondary | ICD-10-CM | POA: Diagnosis not present

## 2017-11-09 DIAGNOSIS — E1140 Type 2 diabetes mellitus with diabetic neuropathy, unspecified: Secondary | ICD-10-CM | POA: Diagnosis not present

## 2017-11-09 DIAGNOSIS — I10 Essential (primary) hypertension: Secondary | ICD-10-CM | POA: Diagnosis not present

## 2017-11-27 DIAGNOSIS — K641 Second degree hemorrhoids: Secondary | ICD-10-CM | POA: Diagnosis not present

## 2017-12-04 DIAGNOSIS — K641 Second degree hemorrhoids: Secondary | ICD-10-CM | POA: Diagnosis not present

## 2018-01-24 DIAGNOSIS — H25813 Combined forms of age-related cataract, bilateral: Secondary | ICD-10-CM | POA: Diagnosis not present

## 2018-01-24 DIAGNOSIS — E119 Type 2 diabetes mellitus without complications: Secondary | ICD-10-CM | POA: Diagnosis not present

## 2018-01-24 DIAGNOSIS — H04123 Dry eye syndrome of bilateral lacrimal glands: Secondary | ICD-10-CM | POA: Diagnosis not present

## 2018-02-16 DIAGNOSIS — K641 Second degree hemorrhoids: Secondary | ICD-10-CM | POA: Diagnosis not present

## 2018-02-26 DIAGNOSIS — H04123 Dry eye syndrome of bilateral lacrimal glands: Secondary | ICD-10-CM | POA: Diagnosis not present

## 2018-02-26 DIAGNOSIS — H25812 Combined forms of age-related cataract, left eye: Secondary | ICD-10-CM | POA: Diagnosis not present

## 2018-02-26 DIAGNOSIS — H268 Other specified cataract: Secondary | ICD-10-CM | POA: Diagnosis not present

## 2018-02-26 DIAGNOSIS — H25813 Combined forms of age-related cataract, bilateral: Secondary | ICD-10-CM | POA: Diagnosis not present

## 2018-02-26 DIAGNOSIS — E119 Type 2 diabetes mellitus without complications: Secondary | ICD-10-CM | POA: Diagnosis not present

## 2018-02-27 DIAGNOSIS — H25813 Combined forms of age-related cataract, bilateral: Secondary | ICD-10-CM | POA: Diagnosis not present

## 2018-02-27 DIAGNOSIS — H04123 Dry eye syndrome of bilateral lacrimal glands: Secondary | ICD-10-CM | POA: Diagnosis not present

## 2018-02-27 DIAGNOSIS — E119 Type 2 diabetes mellitus without complications: Secondary | ICD-10-CM | POA: Diagnosis not present

## 2018-03-13 DIAGNOSIS — Z961 Presence of intraocular lens: Secondary | ICD-10-CM | POA: Diagnosis not present

## 2018-03-19 DIAGNOSIS — F1020 Alcohol dependence, uncomplicated: Secondary | ICD-10-CM | POA: Diagnosis not present

## 2018-03-19 DIAGNOSIS — K625 Hemorrhage of anus and rectum: Secondary | ICD-10-CM | POA: Diagnosis not present

## 2018-03-19 DIAGNOSIS — E782 Mixed hyperlipidemia: Secondary | ICD-10-CM | POA: Diagnosis not present

## 2018-03-19 DIAGNOSIS — E1140 Type 2 diabetes mellitus with diabetic neuropathy, unspecified: Secondary | ICD-10-CM | POA: Diagnosis not present

## 2018-03-19 DIAGNOSIS — I10 Essential (primary) hypertension: Secondary | ICD-10-CM | POA: Diagnosis not present

## 2018-03-19 DIAGNOSIS — E118 Type 2 diabetes mellitus with unspecified complications: Secondary | ICD-10-CM | POA: Diagnosis not present

## 2018-03-19 DIAGNOSIS — K700 Alcoholic fatty liver: Secondary | ICD-10-CM | POA: Diagnosis not present

## 2018-03-19 DIAGNOSIS — Z6829 Body mass index (BMI) 29.0-29.9, adult: Secondary | ICD-10-CM | POA: Diagnosis not present

## 2018-04-03 DIAGNOSIS — Z961 Presence of intraocular lens: Secondary | ICD-10-CM | POA: Diagnosis not present

## 2018-04-09 DIAGNOSIS — R319 Hematuria, unspecified: Secondary | ICD-10-CM | POA: Diagnosis not present

## 2018-04-09 DIAGNOSIS — R102 Pelvic and perineal pain: Secondary | ICD-10-CM | POA: Diagnosis not present

## 2018-04-09 DIAGNOSIS — M25551 Pain in right hip: Secondary | ICD-10-CM | POA: Diagnosis not present

## 2018-05-18 DIAGNOSIS — Z1231 Encounter for screening mammogram for malignant neoplasm of breast: Secondary | ICD-10-CM | POA: Diagnosis not present

## 2018-05-18 DIAGNOSIS — Z01411 Encounter for gynecological examination (general) (routine) with abnormal findings: Secondary | ICD-10-CM | POA: Diagnosis not present

## 2018-05-18 DIAGNOSIS — Z6832 Body mass index (BMI) 32.0-32.9, adult: Secondary | ICD-10-CM | POA: Diagnosis not present

## 2018-05-18 DIAGNOSIS — N814 Uterovaginal prolapse, unspecified: Secondary | ICD-10-CM | POA: Diagnosis not present

## 2018-05-18 DIAGNOSIS — Z124 Encounter for screening for malignant neoplasm of cervix: Secondary | ICD-10-CM | POA: Diagnosis not present

## 2018-06-19 DIAGNOSIS — Z0000 Encounter for general adult medical examination without abnormal findings: Secondary | ICD-10-CM | POA: Diagnosis not present

## 2018-06-19 DIAGNOSIS — I10 Essential (primary) hypertension: Secondary | ICD-10-CM | POA: Diagnosis not present

## 2018-06-19 DIAGNOSIS — M159 Polyosteoarthritis, unspecified: Secondary | ICD-10-CM | POA: Diagnosis not present

## 2018-06-19 DIAGNOSIS — Z6830 Body mass index (BMI) 30.0-30.9, adult: Secondary | ICD-10-CM | POA: Diagnosis not present

## 2018-06-19 DIAGNOSIS — F1020 Alcohol dependence, uncomplicated: Secondary | ICD-10-CM | POA: Diagnosis not present

## 2018-06-19 DIAGNOSIS — F330 Major depressive disorder, recurrent, mild: Secondary | ICD-10-CM | POA: Diagnosis not present

## 2018-06-19 DIAGNOSIS — M545 Low back pain: Secondary | ICD-10-CM | POA: Diagnosis not present

## 2018-06-19 DIAGNOSIS — E1140 Type 2 diabetes mellitus with diabetic neuropathy, unspecified: Secondary | ICD-10-CM | POA: Diagnosis not present

## 2018-06-25 ENCOUNTER — Ambulatory Visit: Payer: Self-pay | Admitting: General Surgery

## 2018-06-25 DIAGNOSIS — K641 Second degree hemorrhoids: Secondary | ICD-10-CM | POA: Diagnosis not present

## 2018-06-25 NOTE — H&P (Signed)
History of Present Illness Maureen Ruff MD; 0/01/8118 12:15 PM) The patient is a 68 year old female who presents with a complaint of Rectal bleeding. 68 year old female who presented to the office last year with rectal bleeding that had been present for approximately 2 years. It was intermittent in nature. It occurred after bowel movements. She states her last colonoscopy was approximately 2-3 yrs ago at [place]. She reports a history of alcoholism but stopped drinking over the last year. She does endorse some constipation with straining previously. She does have regular bowel movements now. She is taking 2 fiber gummies a day. She underwent rubber band ligation in July 2019. This seemed to have resolved her bleeding. She then states over the last month she has had worsening rectal bleeding with bowel movements. This is associated with anal pain. She is having 2-3 bowel movements a day which is more than her usual. She also has a feeling of urgency   Problem List/Past Medical Maureen Ruff, MD; 05/27/7827 12:15 PM) [place], GRADE 2 (K64.1)  Past Surgical History Maureen Ruff, MD; 09/25/2128 12:15 PM) Cesarean Section - 1  Diagnostic Studies History Maureen Ruff, MD; 12/27/5782 12:15 PM) Pap Smear 1-5 years ago  Allergies Sabino Gasser, [place]; 06/25/2018 11:56 AM) Sulfa Drugs Allergies Reconciled  Medication History Sabino Gasser, CMA; 06/25/2018 11:56 AM) Irbesartan-Hydrochlorothiazide (150-12.5MG  Tablet, Oral) Active. Janumet XR (100-1000MG  Tablet ER 24HR, Oral) Active. AmLODIPine Besylate (5MG  Tablet, Oral) Active. Medications Reconciled  Social History Maureen Ruff, MD; 10/29/6293 12:15 PM) Alcohol use Moderate alcohol use. Caffeine use Coffee. Illicit drug use Prefer to discuss with provider. Tobacco use Former smoker.  Family History Maureen Ruff, MD; 06/30/4130 12:15 PM) Alcohol Abuse Mother, Sister, Son. Bleeding disorder  Sister. Breast Cancer Mother, Sister. Diabetes Mellitus Brother, Father, Mother, Sister, Son. Heart Disease Father. Hypertension Mother.  Pregnancy / Birth History Maureen Ruff, MD; 08/24/100 12:15 PM) Age at menarche 38 years. Age of menopause 62-55 Maternal age 53-25 Para 2  Other Problems Maureen Ruff, MD; 11/21/5364 12:15 PM) Alcohol Abuse     Review of Systems Maureen Ruff MD; 08/24/345 12:15 PM) HEENT Not Present- Earache, Hearing Loss, Hoarseness, Nose Bleed, Oral Ulcers, Ringing in the Ears, Seasonal Allergies, Sinus Pain, Sore Throat, Visual Disturbances, Wears glasses/contact lenses and Yellow Eyes. Respiratory Not Present- Bloody sputum, Chronic Cough, Difficulty Breathing, Snoring and Wheezing. Breast Not Present- Breast Mass, Breast Pain, Nipple Discharge and Skin Changes. Cardiovascular Not Present- Chest Pain, Difficulty Breathing Lying Down, Leg Cramps, Palpitations, Rapid Heart Rate, Shortness of Breath and Swelling of Extremities. Gastrointestinal Present- Hemorrhoids and Rectal Pain. Not Present- Abdominal Pain, Bloating, Bloody Stool, Change in Bowel Habits, Chronic diarrhea, Constipation, Difficulty Swallowing, Excessive gas, Gets full quickly at meals, Indigestion, Nausea and Vomiting. Female Genitourinary Present- Nocturia. Not Present- Frequency, Painful Urination, Pelvic Pain and Urgency. Musculoskeletal Not Present- Back Pain, Joint Pain, Joint Stiffness, Muscle Pain, Muscle Weakness and Swelling of Extremities. Psychiatric Present- Change in Sleep Pattern. Not Present- Anxiety, Bipolar, Depression, Fearful and Frequent crying. Hematology Not Present- Blood Thinners, Easy Bruising, Excessive bleeding, Gland problems, HIV and Persistent Infections.  Vitals Sabino Gasser CMA; 06/25/2018 11:57 AM) 06/25/2018 11:56 AM Weight: 164.13 lb Height: 59in Body Surface Area: 1.7 m Body Mass Index: 33.15 kg/m  Temp.: 98.74F(Oral)  Pulse: 85 (Regular)   BP: 124/70 (Sitting, Left Arm, Standard)      Physical Exam Maureen Ruff MD; 08/23/5954 12:16 PM)  General Mental Status-Alert. General Appearance-Not in acute distress. Build & Nutrition-Well nourished. Posture-Normal posture.  Gait-Normal.  Head and Neck Head-normocephalic, atraumatic with no lesions or palpable masses. Trachea-midline.  Chest and Lung Exam Chest and lung exam reveals -on auscultation, normal breath sounds, no adventitious sounds and normal vocal resonance.  Cardiovascular Cardiovascular examination reveals -normal heart sounds, regular rate and rhythm with no murmurs and no digital clubbing, cyanosis, edema, increased warmth or tenderness.  Abdomen Inspection Inspection of the abdomen reveals - No Hernias. Palpation/Percussion Palpation and Percussion of the abdomen reveal - Soft, Non Tender, No Rigidity (guarding), No hepatosplenomegaly and No Palpable abdominal masses.  Rectal Anorectal Exam External - normal external exam. Internal - normal internal exam. Note: mildly decreased sphincter tone.  Neurologic Neurologic evaluation reveals -alert and oriented x 3 with no impairment of recent or remote memory, normal attention span and ability to concentrate, normal sensation and normal coordination.  Musculoskeletal Normal Exam - Bilateral-Upper Extremity Strength Normal and Lower Extremity Strength Normal.   Results Maureen Ruff MD; 01/23/4267 12:12 PM) Procedures  Name Value Date ANOSCOPY, DIAGNOSTIC (34196) [ Hemorrhoids ] Procedure Other: Procedure: Anoscopy....Marland KitchenMarland KitchenSurgeon: Marcello Moores....Marland KitchenMarland KitchenAfter the risks and benefits were explained, verbal consent was obtained for above procedure. A medical assistant chaperone was present thoroughout the entire procedure. ....Marland KitchenMarland KitchenAnesthesia: none....Marland KitchenMarland KitchenDiagnosis: Rectal bleeding......Marland KitchenFindings: Grade 2 right posterior internal hemorrhoid with inflamed external component. Grade 2  non-inflamed right anterior hemorrhoid, grade 1 left lateral hemorrhoid  Performed: 06/25/2018 12:11 PM    Assessment & Plan Maureen Ruff MD; 06/24/2977 12:18 PM)  PROLAPSED INTERNAL HEMORRHOIDS, GRADE 2 (K64.1) Impression: 68 year old female who presents to the office with complaints of rectal bleeding. On exam today she does have an area of inflamed tissue distal to the dentate line on her right posterior hemorrhoid. We discussed that this may be the cause of her rectal bleeding. We discussed either proceeding with colonoscopy to rule out any other source or undergoing hemorrhoidectomy to remove this as a potential source for her bleeding. She has decided to undergo hemorrhoidectomy. We have discussed the typical postoperative pain and recovery. We discussed the issues of recurrence if she continues to have difficulty with her bowel movements. I believe she understands this and wishes to proceed with surgery.

## 2018-07-31 DIAGNOSIS — E1140 Type 2 diabetes mellitus with diabetic neuropathy, unspecified: Secondary | ICD-10-CM | POA: Diagnosis not present

## 2018-07-31 DIAGNOSIS — F330 Major depressive disorder, recurrent, mild: Secondary | ICD-10-CM | POA: Diagnosis not present

## 2018-07-31 DIAGNOSIS — I10 Essential (primary) hypertension: Secondary | ICD-10-CM | POA: Diagnosis not present

## 2018-08-03 DIAGNOSIS — F1020 Alcohol dependence, uncomplicated: Secondary | ICD-10-CM | POA: Diagnosis not present

## 2018-08-03 DIAGNOSIS — E782 Mixed hyperlipidemia: Secondary | ICD-10-CM | POA: Diagnosis not present

## 2018-08-03 DIAGNOSIS — K625 Hemorrhage of anus and rectum: Secondary | ICD-10-CM | POA: Diagnosis not present

## 2018-08-03 DIAGNOSIS — K700 Alcoholic fatty liver: Secondary | ICD-10-CM | POA: Diagnosis not present

## 2018-08-03 DIAGNOSIS — I10 Essential (primary) hypertension: Secondary | ICD-10-CM | POA: Diagnosis not present

## 2018-08-03 DIAGNOSIS — J111 Influenza due to unidentified influenza virus with other respiratory manifestations: Secondary | ICD-10-CM | POA: Diagnosis not present

## 2018-08-03 DIAGNOSIS — E1140 Type 2 diabetes mellitus with diabetic neuropathy, unspecified: Secondary | ICD-10-CM | POA: Diagnosis not present

## 2018-08-03 DIAGNOSIS — F330 Major depressive disorder, recurrent, mild: Secondary | ICD-10-CM | POA: Diagnosis not present

## 2018-08-03 DIAGNOSIS — E118 Type 2 diabetes mellitus with unspecified complications: Secondary | ICD-10-CM | POA: Diagnosis not present

## 2018-08-03 DIAGNOSIS — M545 Low back pain: Secondary | ICD-10-CM | POA: Diagnosis not present

## 2018-08-08 DIAGNOSIS — N321 Vesicointestinal fistula: Secondary | ICD-10-CM | POA: Diagnosis not present

## 2018-08-08 DIAGNOSIS — N390 Urinary tract infection, site not specified: Secondary | ICD-10-CM | POA: Diagnosis not present

## 2018-08-20 DIAGNOSIS — J069 Acute upper respiratory infection, unspecified: Secondary | ICD-10-CM | POA: Diagnosis not present

## 2018-08-23 DIAGNOSIS — D649 Anemia, unspecified: Secondary | ICD-10-CM | POA: Diagnosis not present

## 2018-08-23 DIAGNOSIS — E1140 Type 2 diabetes mellitus with diabetic neuropathy, unspecified: Secondary | ICD-10-CM | POA: Diagnosis not present

## 2018-08-23 DIAGNOSIS — I10 Essential (primary) hypertension: Secondary | ICD-10-CM | POA: Diagnosis not present

## 2018-08-23 DIAGNOSIS — E782 Mixed hyperlipidemia: Secondary | ICD-10-CM | POA: Diagnosis not present

## 2018-08-23 DIAGNOSIS — K625 Hemorrhage of anus and rectum: Secondary | ICD-10-CM | POA: Diagnosis not present

## 2018-08-23 DIAGNOSIS — F330 Major depressive disorder, recurrent, mild: Secondary | ICD-10-CM | POA: Diagnosis not present

## 2018-08-23 DIAGNOSIS — F1020 Alcohol dependence, uncomplicated: Secondary | ICD-10-CM | POA: Diagnosis not present

## 2018-08-23 DIAGNOSIS — E118 Type 2 diabetes mellitus with unspecified complications: Secondary | ICD-10-CM | POA: Diagnosis not present

## 2018-08-23 DIAGNOSIS — K700 Alcoholic fatty liver: Secondary | ICD-10-CM | POA: Diagnosis not present

## 2018-08-27 DIAGNOSIS — J069 Acute upper respiratory infection, unspecified: Secondary | ICD-10-CM | POA: Diagnosis not present

## 2018-09-02 DIAGNOSIS — H66009 Acute suppurative otitis media without spontaneous rupture of ear drum, unspecified ear: Secondary | ICD-10-CM | POA: Diagnosis not present

## 2018-09-05 ENCOUNTER — Observation Stay (HOSPITAL_COMMUNITY)
Admission: EM | Admit: 2018-09-05 | Discharge: 2018-09-07 | Disposition: A | Discharge status: Home / Self Care | Payer: Medicare HMO | Attending: Internal Medicine | Admitting: Internal Medicine

## 2018-09-05 ENCOUNTER — Emergency Department (HOSPITAL_COMMUNITY): Payer: Medicare HMO

## 2018-09-05 ENCOUNTER — Other Ambulatory Visit: Payer: Self-pay

## 2018-09-05 ENCOUNTER — Encounter (HOSPITAL_COMMUNITY): Payer: Self-pay | Admitting: Emergency Medicine

## 2018-09-05 DIAGNOSIS — Z8249 Family history of ischemic heart disease and other diseases of the circulatory system: Secondary | ICD-10-CM | POA: Diagnosis not present

## 2018-09-05 DIAGNOSIS — I2584 Coronary atherosclerosis due to calcified coronary lesion: Secondary | ICD-10-CM | POA: Diagnosis not present

## 2018-09-05 DIAGNOSIS — R0789 Other chest pain: Secondary | ICD-10-CM

## 2018-09-05 DIAGNOSIS — Z794 Long term (current) use of insulin: Secondary | ICD-10-CM | POA: Diagnosis not present

## 2018-09-05 DIAGNOSIS — Z79899 Other long term (current) drug therapy: Secondary | ICD-10-CM | POA: Diagnosis not present

## 2018-09-05 DIAGNOSIS — Z87891 Personal history of nicotine dependence: Secondary | ICD-10-CM | POA: Diagnosis not present

## 2018-09-05 DIAGNOSIS — I200 Unstable angina: Secondary | ICD-10-CM | POA: Diagnosis present

## 2018-09-05 DIAGNOSIS — R0602 Shortness of breath: Secondary | ICD-10-CM | POA: Diagnosis not present

## 2018-09-05 DIAGNOSIS — I10 Essential (primary) hypertension: Secondary | ICD-10-CM | POA: Insufficient documentation

## 2018-09-05 DIAGNOSIS — E785 Hyperlipidemia, unspecified: Secondary | ICD-10-CM | POA: Insufficient documentation

## 2018-09-05 DIAGNOSIS — E782 Mixed hyperlipidemia: Secondary | ICD-10-CM | POA: Diagnosis not present

## 2018-09-05 DIAGNOSIS — E119 Type 2 diabetes mellitus without complications: Secondary | ICD-10-CM | POA: Insufficient documentation

## 2018-09-05 DIAGNOSIS — I700 Atherosclerosis of aorta: Secondary | ICD-10-CM | POA: Insufficient documentation

## 2018-09-05 DIAGNOSIS — Z882 Allergy status to sulfonamides status: Secondary | ICD-10-CM | POA: Insufficient documentation

## 2018-09-05 DIAGNOSIS — R079 Chest pain, unspecified: Secondary | ICD-10-CM | POA: Diagnosis not present

## 2018-09-05 DIAGNOSIS — I25110 Atherosclerotic heart disease of native coronary artery with unstable angina pectoris: Secondary | ICD-10-CM | POA: Diagnosis not present

## 2018-09-05 LAB — CBC WITH DIFFERENTIAL/PLATELET
Abs Immature Granulocytes: 0.04 10*3/uL (ref 0.00–0.07)
Basophils Absolute: 0 10*3/uL (ref 0.0–0.1)
Basophils Relative: 0 %
Eosinophils Absolute: 0.1 10*3/uL (ref 0.0–0.5)
Eosinophils Relative: 1 %
HCT: 32.2 % — ABNORMAL LOW (ref 36.0–46.0)
Hemoglobin: 10 g/dL — ABNORMAL LOW (ref 12.0–15.0)
Immature Granulocytes: 0 %
Lymphocytes Relative: 25 %
Lymphs Abs: 2.3 10*3/uL (ref 0.7–4.0)
MCH: 22.8 pg — ABNORMAL LOW (ref 26.0–34.0)
MCHC: 31.1 g/dL (ref 30.0–36.0)
MCV: 73.3 fL — ABNORMAL LOW (ref 80.0–100.0)
Monocytes Absolute: 0.5 10*3/uL (ref 0.1–1.0)
Monocytes Relative: 5 %
Neutro Abs: 6.1 10*3/uL (ref 1.7–7.7)
Neutrophils Relative %: 69 %
Platelets: 321 10*3/uL (ref 150–400)
RBC: 4.39 MIL/uL (ref 3.87–5.11)
RDW: 16.2 % — ABNORMAL HIGH (ref 11.5–15.5)
WBC: 9 10*3/uL (ref 4.0–10.5)
nRBC: 0 % (ref 0.0–0.2)

## 2018-09-05 LAB — COMPREHENSIVE METABOLIC PANEL
ALT: 33 U/L (ref 0–44)
AST: 31 U/L (ref 15–41)
Albumin: 3.7 g/dL (ref 3.5–5.0)
Alkaline Phosphatase: 78 U/L (ref 38–126)
Anion gap: 10 (ref 5–15)
BUN: 10 mg/dL (ref 8–23)
CO2: 23 mmol/L (ref 22–32)
Calcium: 9.4 mg/dL (ref 8.9–10.3)
Chloride: 107 mmol/L (ref 98–111)
Creatinine, Ser: 0.86 mg/dL (ref 0.44–1.00)
GFR calc Af Amer: >60 mL/min (ref >60)
GFR calc non Af Amer: >60 mL/min (ref >60)
Glucose, Bld: 101 mg/dL — ABNORMAL HIGH (ref 70–99)
Potassium: 4.3 mmol/L (ref 3.5–5.1)
Sodium: 140 mmol/L (ref 135–145)
Total Bilirubin: 0.5 mg/dL (ref 0.3–1.2)
Total Protein: 7.2 g/dL (ref 6.5–8.1)

## 2018-09-05 LAB — LIPASE, BLOOD: Lipase: 27 U/L (ref 11–51)

## 2018-09-05 LAB — TROPONIN I
Troponin I: <0.03 ng/mL (ref <0.03)
Troponin I: <0.03 ng/mL (ref <0.03)

## 2018-09-05 LAB — D-DIMER, QUANTITATIVE (NOT AT ARMC): D-Dimer, Quant: 0.7 ug/mL-FEU — ABNORMAL HIGH (ref 0.00–0.50)

## 2018-09-05 MED ORDER — ASPIRIN 81 MG PO CHEW
324.0000 mg | CHEWABLE_TABLET | Freq: Once | ORAL | Status: AC
  Administered 2018-09-05: 324 mg via ORAL
  Filled 2018-09-05: qty 4

## 2018-09-05 MED ORDER — NITROGLYCERIN 0.4 MG SL SUBL
0.4000 mg | SUBLINGUAL_TABLET | SUBLINGUAL | Status: DC | PRN
  Administered 2018-09-05 (×2): 0.4 mg via SUBLINGUAL

## 2018-09-05 MED ORDER — INSULIN ASPART 100 UNIT/ML ~~LOC~~ SOLN: 0.0000 [IU] | Freq: Every day | SUBCUTANEOUS | Status: DC

## 2018-09-05 MED ORDER — ACETAMINOPHEN 325 MG PO TABS
650.0000 mg | ORAL_TABLET | ORAL | Status: DC | PRN
  Administered 2018-09-06: 650 mg via ORAL
  Filled 2018-09-05 (×2): qty 2

## 2018-09-05 MED ORDER — IOHEXOL 350 MG/ML SOLN
80.0000 mL | Freq: Once | INTRAVENOUS | Status: AC | PRN
  Administered 2018-09-05: 53 mL via INTRAVENOUS

## 2018-09-05 MED ORDER — ENOXAPARIN SODIUM 80 MG/0.8ML ~~LOC~~ SOLN
1.0000 mg/kg | Freq: Two times a day (BID) | SUBCUTANEOUS | Status: DC
  Administered 2018-09-06: 75 mg via SUBCUTANEOUS
  Filled 2018-09-05: qty 0.8

## 2018-09-05 MED ORDER — ONDANSETRON HCL 4 MG/2ML IJ SOLN: 4.0000 mg | Freq: Four times a day (QID) | INTRAMUSCULAR | Status: DC | PRN

## 2018-09-05 MED ORDER — INSULIN ASPART 100 UNIT/ML ~~LOC~~ SOLN: 0.0000 [IU] | Freq: Three times a day (TID) | SUBCUTANEOUS | Status: DC

## 2018-09-05 NOTE — H&P (Signed)
History and Physical   Maureen Bishop:[id number] DOB: 09-10-1950 DOA: 09/05/2018  Referring MD/NP/PA: Dr Ellender Hose  PCP: Lucianne Lei, MD   Outpatient Specialists: None   Patient coming from: Home  Chief Complaint: Chest Pain  HPI: Maureen Bishop is a 68 y.o. female with medical history significant of hypertension, diabetes, remote hyperlipidemia, remote tobacco smoking and strong family history of early coronary artery disease with her father dying at the age of 45 with MI.  Patient presents with left-sided chest pain radiating to her arm.  Symptoms have been going on for 2 days now.  Associated with some mild shortness of breath.  Initially patient thought it was due to the pollens.  She moved down here with her sister from up [place] a few years ago.  Her sister was diagnosed with adult onset asthma at the age of 70.  Patient also has noted that she had chest pain with shortness of breath last year when the pollen was high.  The chest pain this time around however is different.  Is rated as 6 out of 10.  It was relieved with nitro and pain medicine in the ER.  With her risk factors she is considered at medium to high risk for coronary artery disease and will be admitted to the hospital for rule out MI.  So far initial enzymes and EKG are within normal limits..  ED Course: Temperature is 98 blood pressure 146/83 pulse 89.  Respiratory rate is 18 oxygen sats 95% on room air.  White count is 9.0 hemoglobin is 10.0 with platelet 321.  Chemistry appears to be within normal.  Chest x-ray showed no active findings.  CT angiogram of the chest shows no acute findings.  Patient is being admitted for rule out MI.  Review of Systems: As per HPI otherwise 10 point review of systems negative.    Past Medical History:  Diagnosis Date   Diabetes mellitus without complication ([place])    Hypertension     Past Surgical History:  Procedure Laterality Date   CESAREAN SECTION       reports that she has  quit smoking. She has never used smokeless tobacco. She reports current alcohol use of about 10.0 standard drinks of alcohol per week. She reports that she does not use drugs.  Allergies  Allergen Reactions   Sulfa Antibiotics Hives   Sulfasalazine Hives    No family history on file.   Prior to Admission medications   Medication Sig Start Date End Date Taking? Authorizing Provider  acetaminophen (TYLENOL) 500 MG tablet Take 1,000 mg by mouth every 4 (four) hours as needed. pain    [provider]  ibuprofen (ADVIL,MOTRIN) 200 MG tablet Take 400 mg by mouth every 6 (six) hours as needed for pain.    [provider]  Olmesartan-Amlodipine-HCTZ (TRIBENZOR) 20-5-12.5 MG TABS Take 1 tablet by mouth daily.    [provider]  oxyCODONE-acetaminophen (PERCOCET/ROXICET) 5-325 MG per tablet Take 1-2 tablets by mouth every 6 (six) hours as needed for pain. 01/12/13   Davonna Belling, MD    Physical Exam: Vitals:   09/05/18 1645 09/05/18 1745 09/05/18 1800 09/05/18 1845  BP: (!) 141/61 135/68 (!) 129/57 (!) 141/79  Pulse: 84 89 76 89  Resp: 10 16 14 14   Temp:      TempSrc:      SpO2: 100% 100% 100% 99%  Weight:      Height:          Constitutional:  NAD, calm, comfortable, very anxious Vitals:   09/05/18 1645 09/05/18 1745 09/05/18 1800 09/05/18 1845  BP: (!) 141/61 135/68 (!) 129/57 (!) 141/79  Pulse: 84 89 76 89  Resp: 10 16 14 14   Temp:      TempSrc:      SpO2: 100% 100% 100% 99%  Weight:      Height:       Eyes: PERRL, lids and conjunctivae normal ENMT: Mucous membranes are moist. Posterior pharynx clear of any exudate or lesions.Normal dentition.  Neck: normal, supple, no masses, no thyromegaly Respiratory: clear to auscultation bilaterally, no wheezing, no crackles. Normal respiratory effort. No accessory muscle use.  Cardiovascular: Regular rate and rhythm, no murmurs / rubs / gallops. No extremity edema. 2+ pedal pulses. No carotid bruits.    Abdomen: no tenderness, no masses palpated. No hepatosplenomegaly. Bowel sounds positive.  Musculoskeletal: no clubbing / cyanosis. No joint deformity upper and lower extremities. Good ROM, no contractures. Normal muscle tone.  Skin: no rashes, lesions, ulcers. No induration Neurologic: CN 2-12 grossly intact. Sensation intact, DTR normal. Strength 5/5 in all 4.  Psychiatric: Normal judgment and insight. Alert and oriented x 3. Normal mood.     Labs on Admission: I have personally reviewed following labs and imaging studies  CBC: Recent Labs  Lab 09/05/18 1532  WBC 9.0  NEUTROABS 6.1  HGB 10.0*  HCT 32.2*  MCV 73.3*  PLT 283   Basic Metabolic Panel: Recent Labs  Lab 09/05/18 1532  NA 140  K 4.3  CL 107  CO2 23  GLUCOSE 101*  BUN 10  CREATININE 0.86  CALCIUM 9.4   GFR: Estimated Creatinine Clearance: 55.1 mL/min (by C-G formula based on SCr of 0.86 mg/dL). Liver Function Tests: Recent Labs  Lab 09/05/18 1532  AST 31  ALT 33  ALKPHOS 78  BILITOT 0.5  PROT 7.2  ALBUMIN 3.7   Recent Labs  Lab 09/05/18 1532  LIPASE 27   No results for input(s): AMMONIA in the last 168 hours. Coagulation Profile: No results for input(s): INR, PROTIME in the last 168 hours. Cardiac Enzymes: Recent Labs  Lab 09/05/18 1532  TROPONINI <0.03   BNP (last 3 results) No results for input(s): PROBNP in the last 8760 hours. HbA1C: No results for input(s): HGBA1C in the last 72 hours. CBG: No results for input(s): GLUCAP in the last 168 hours. Lipid Profile: No results for input(s): CHOL, HDL, LDLCALC, TRIG, CHOLHDL, LDLDIRECT in the last 72 hours. Thyroid Function Tests: No results for input(s): TSH, T4TOTAL, FREET4, T3FREE, THYROIDAB in the last 72 hours. Anemia Panel: No results for input(s): VITAMINB12, FOLATE, FERRITIN, TIBC, IRON, RETICCTPCT in the last 72 hours. Urine analysis:    Component Value Date/Time   COLORURINE YELLOW 08/22/2011 1017   APPEARANCEUR CLOUDY  (A) 08/22/2011 1017   LABSPEC 1.020 08/22/2011 1017   PHURINE 5.0 08/22/2011 1017   GLUCOSEU NEGATIVE 08/22/2011 1017   HGBUR TRACE (A) 08/22/2011 1017   BILIRUBINUR NEGATIVE 08/22/2011 1017   KETONESUR NEGATIVE 08/22/2011 1017   PROTEINUR NEGATIVE 08/22/2011 1017   UROBILINOGEN 0.2 08/22/2011 1017   NITRITE NEGATIVE 08/22/2011 1017   LEUKOCYTESUR MODERATE (A) 08/22/2011 1017   Sepsis Labs: @LABRCNTIP (procalcitonin:4,lacticidven:4) )No results found for this or any previous visit (from the past 240 hour(s)).   Radiological Exams on Admission: Dg Chest 2 View  Result Date: 09/05/2018 CLINICAL DATA:  68 year old female with chest pain EXAM: CHEST - 2 VIEW COMPARISON:  10/27/2017 FINDINGS: Cardiomediastinal silhouette within normal limits in  size and contour. No evidence of central vascular congestion. No pneumothorax or pleural effusion. No confluent airspace disease. No displaced fracture. IMPRESSION: Negative for acute cardiopulmonary disease Electronically Signed   By: Corrie Mckusick D.O.   On: 09/05/2018 16:04   Ct Angio Chest Pe W And/or Wo Contrast  Result Date: 09/05/2018 CLINICAL DATA:  Shortness of breath EXAM: CT ANGIOGRAPHY CHEST WITH CONTRAST TECHNIQUE: Multidetector CT imaging of the chest was performed using the standard protocol during bolus administration of intravenous contrast. Multiplanar CT image reconstructions and MIPs were obtained to evaluate the vascular anatomy. CONTRAST:  15mL OMNIPAQUE IOHEXOL 350 MG/ML SOLN COMPARISON:  None. FINDINGS: Cardiovascular: --Pulmonary arteries: Contrast injection is sufficient to demonstrate satisfactory opacification of the pulmonary arteries to the segmental level, with attenuation of 315 HU at the main pulmonary artery. There is no pulmonary embolus. The main pulmonary artery is within normal limits for size. --Aorta: Satisfactory opacification of the thoracic aorta. No aortic dissection or other acute aortic syndrome. Conventional 3  vessel aortic branching pattern. The aortic course and caliber are normal. There is mild aortic atherosclerosis. --Heart: Normal size. No pericardial effusion. Mediastinum/Nodes: No mediastinal, hilar or axillary lymphadenopathy. The visualized thyroid and thoracic esophageal course are unremarkable. Lungs/Pleura: No pulmonary nodules or masses. No pleural effusion or pneumothorax. No focal airspace consolidation. No focal pleural abnormality. Upper Abdomen: Contrast bolus timing is not optimized for evaluation of the abdominal organs. Within this limitation, the visualized organs of the upper abdomen are normal. Musculoskeletal: No chest wall abnormality. No acute or significant osseous findings. Review of the MIP images confirms the above findings. IMPRESSION: No pulmonary embolus or other acute thoracic abnormality. Aortic atherosclerosis (ICD10-I70.0). Electronically Signed   By: Ulyses Jarred M.D.   On: 09/05/2018 17:32    EKG: Independently reviewed.  It shows normal sinus rhythm with a rate of 80.  Low voltage.  Nonspecific ST changes.  Assessment/Plan Principal Problem:   Chest pain Active Problems:   Benign essential HTN   Type 2 diabetes mellitus without complication ([place])   Hyperlipidemia     #1 chest pain: Patient has medium risk for coronary artery disease.  She will be admitted for rule out MI.  We will check serial enzymes x3.  Associated with aspirin statin as well as nitroglycerin as needed.  Patient will most likely benefit from cardiac stress testing if enzymes are negative due to her moderate to high risk factors.  She has never had any cardiac work-up.  Her father died at the age of 55 from coronary artery disease.  #2 diabetes: Sliding scale insulin with home regimen.  #3 hypertension: Blood pressure so far controlled.  We will obtain her home regimen and continue.  #4 hyperlipidemia: Patient denied being on any statin.  We will check fasting lipid panel and consider statin  if elevated.  Also is being a diabetic she partial benefit from simvastatin.  #5 anxiety status: Patient appears anxious.  This may be failure her symptoms.  Monitor closely and treat as needed.  #6 possible airway reactive disease: Patient moved out from the ER and appears to be having pollens allergy.  She is has some bronchospasm from that from history.  Currently not wheezing.  She may require empiric treatment with albuterol MDI at discharge.   DVT prophylaxis: Lovenox Code Status: Full code Family Communication: Discussed carefully with patient no family available Disposition Plan: Home Consults called: None Admission status: Observation  Severity of Illness: The appropriate patient status for this patient  is OBSERVATION. Observation status is judged to be reasonable and necessary in order to provide the required intensity of service to ensure the patient's safety. The patient's presenting symptoms, physical exam findings, and initial radiographic and laboratory data in the context of their medical condition is felt to place them at decreased risk for further clinical deterioration. Furthermore, it is anticipated that the patient will be medically stable for discharge from the hospital within 2 midnights of admission. The following factors support the patient status of observation.   " The patient's presenting symptoms include chest pain. " The physical exam findings include no significant finding on exam. " The initial radiographic and laboratory data are normal EKG and troponin.     Barbette Merino MD [facility] Pager [phone]  If 7PM-7AM, please contact night-coverage [email or web address] Password [facility]  09/05/2018, 6:54 PM

## 2018-09-05 NOTE — ED Notes (Signed)
ED TO INPATIENT HANDOFF REPORT  ED Nurse Name and Phone #: [place]  S Name/Age/Gender Maureen Bishop 68 y.o. female Room/Bed: 026C/026C  Code Status   Code Status: Not on file  Home/SNF/Other Home Patient oriented to: self, place, time and situation Is this baseline? Yes   Triage Complete: Triage complete  Chief Complaint Chest Pain  Triage Note Pt with left side chest pain with radiation to the left arm since yesterday. Hx of diabetes and HTN.  She denies shortness of breath. A/o NAD at triage.    Allergies Allergies  Allergen Reactions  . Sulfa Antibiotics Hives    Level of Care/Admitting Diagnosis ED Disposition    ED Disposition Condition [facility] Area: [place] [[id number]]  Level of Care: Telemetry Cardiac [103]  I expect the patient will be discharged within 24 hours: Yes  LOW acuity---Tx typically complete <24 hrs---ACUTE conditions typically can be evaluated <24 hours---LABS likely to return to acceptable levels <24 hours---IS near functional baseline---EXPECTED to return to current living arrangement---NOT newly hypoxic: Meets criteria for 5C-Observation unit  Diagnosis: Chest pain [343568]  Admitting Physician: Elwyn Reach [[id number]]  Attending Physician: Elwyn Reach [[id number]]  Possible Covid Disease Patient Isolation: N/A  PT Class (Do Not Modify): Observation [104]  PT Acc Code (Do Not Modify): Observation [10022]       B Medical/Surgery History Past Medical History:  Diagnosis Date  . Diabetes mellitus without complication ([place])   . Hypertension    Past Surgical History:  Procedure Laterality Date  . CESAREAN SECTION       A IV Location/Drains/Wounds Patient Lines/Drains/Airways Status   Active Line/Drains/Airways    Name:   Placement date:   Placement time:   Site:   Days:   Peripheral IV 09/05/18 Right Antecubital   09/05/18    -    Antecubital   less than 1          Intake/Output  Last 24 hours No intake or output data in the 24 hours ending 09/05/18 1841  Labs/Imaging Results for orders placed or performed during the hospital encounter of 09/05/18 (from the past 48 hour(s))  CBC with Differential     Status: Abnormal   Collection Time: 09/05/18  3:32 PM  Result Value Ref Range   WBC 9.0 4.0 - 10.5 K/uL   RBC 4.39 3.87 - 5.11 MIL/uL   Hemoglobin 10.0 (L) 12.0 - 15.0 g/dL   HCT 32.2 (L) 36.0 - 46.0 %   MCV 73.3 (L) 80.0 - 100.0 fL   MCH 22.8 (L) 26.0 - 34.0 pg   MCHC 31.1 30.0 - 36.0 g/dL   RDW 16.2 (H) 11.5 - 15.5 %   Platelets 321 150 - 400 K/uL   nRBC 0.0 0.0 - 0.2 %   Neutrophils Relative % 69 %   Neutro Abs 6.1 1.7 - 7.7 K/uL   Lymphocytes Relative 25 %   Lymphs Abs 2.3 0.7 - 4.0 K/uL   Monocytes Relative 5 %   Monocytes Absolute 0.5 0.1 - 1.0 K/uL   Eosinophils Relative 1 %   Eosinophils Absolute 0.1 0.0 - 0.5 K/uL   Basophils Relative 0 %   Basophils Absolute 0.0 0.0 - 0.1 K/uL   Immature Granulocytes 0 %   Abs Immature Granulocytes 0.04 0.00 - 0.07 K/uL    Comment: Performed at [facility], [address], [place], [place] [address]  Comprehensive metabolic panel  Status: Abnormal   Collection Time: 09/05/18  3:32 PM  Result Value Ref Range   Sodium 140 135 - 145 mmol/L   Potassium 4.3 3.5 - 5.1 mmol/L   Chloride 107 98 - 111 mmol/L   CO2 23 22 - 32 mmol/L   Glucose, Bld 101 (H) 70 - 99 mg/dL   BUN 10 8 - 23 mg/dL   Creatinine, Ser 0.86 0.44 - 1.00 mg/dL   Calcium 9.4 8.9 - 10.3 mg/dL   Total Protein 7.2 6.5 - 8.1 g/dL   Albumin 3.7 3.5 - 5.0 g/dL   AST 31 15 - 41 U/L   ALT 33 0 - 44 U/L   Alkaline Phosphatase 78 38 - 126 U/L   Total Bilirubin 0.5 0.3 - 1.2 mg/dL   GFR calc non Af Amer >60 >60 mL/min   GFR calc Af Amer >60 >60 mL/min   Anion gap 10 5 - 15    Comment: Performed at [facility], [address], [place], [place] [address]  Lipase, blood     Status: None   Collection Time: 09/05/18  3:32 PM  Result Value  Ref Range   Lipase 27 11 - 51 U/L    Comment: Performed at [facility], [address] [place], [place] [address]  Troponin I - ONCE - STAT     Status: None   Collection Time: 09/05/18  3:32 PM  Result Value Ref Range   Troponin I <0.03 <0.03 ng/mL    Comment: Performed at [facility], [address] [place], [place] [address]  D-dimer, quantitative (not at [facility])     Status: Abnormal   Collection Time: 09/05/18  3:32 PM  Result Value Ref Range   D-Dimer, Quant 0.70 (H) 0.00 - 0.50 ug/mL-FEU    Comment: (NOTE) At the manufacturer cut-off of 0.50 ug/mL FEU, this assay has been documented to exclude PE with a sensitivity and negative predictive value of 97 to 99%.  At this time, this assay has not been approved by the FDA to exclude DVT/VTE. Results should be correlated with clinical presentation. Performed at [facility], [place] [address], [place], [place] [address]    Dg Chest 2 View  Result Date: 09/05/2018 CLINICAL DATA:  68 year old female with chest pain EXAM: CHEST - 2 VIEW COMPARISON:  10/27/2017 FINDINGS: Cardiomediastinal silhouette within normal limits in size and contour. No evidence of central vascular congestion. No pneumothorax or pleural effusion. No confluent airspace disease. No displaced fracture. IMPRESSION: Negative for acute cardiopulmonary disease Electronically Signed   By: Corrie Mckusick D.O.   On: 09/05/2018 16:04   Ct Angio Chest Pe W And/or Wo Contrast  Result Date: 09/05/2018 CLINICAL DATA:  Shortness of breath EXAM: CT ANGIOGRAPHY CHEST WITH CONTRAST TECHNIQUE: Multidetector CT imaging of the chest was performed using the standard protocol during bolus administration of intravenous contrast. Multiplanar CT image reconstructions and MIPs were obtained to evaluate the vascular anatomy. CONTRAST:  76mL OMNIPAQUE IOHEXOL 350 MG/ML SOLN COMPARISON:  None. FINDINGS: Cardiovascular: --Pulmonary arteries: Contrast injection is sufficient to  demonstrate satisfactory opacification of the pulmonary arteries to the segmental level, with attenuation of 315 HU at the main pulmonary artery. There is no pulmonary embolus. The main pulmonary artery is within normal limits for size. --Aorta: Satisfactory opacification of the thoracic aorta. No aortic dissection or other acute aortic syndrome. Conventional 3 vessel aortic branching pattern. The aortic course and caliber are normal. There is mild aortic  atherosclerosis. --Heart: Normal size. No pericardial effusion. Mediastinum/Nodes: No mediastinal, hilar or axillary lymphadenopathy. The visualized thyroid and thoracic esophageal course are unremarkable. Lungs/Pleura: No pulmonary nodules or masses. No pleural effusion or pneumothorax. No focal airspace consolidation. No focal pleural abnormality. Upper Abdomen: Contrast bolus timing is not optimized for evaluation of the abdominal organs. Within this limitation, the visualized organs of the upper abdomen are normal. Musculoskeletal: No chest wall abnormality. No acute or significant osseous findings. Review of the MIP images confirms the above findings. IMPRESSION: No pulmonary embolus or other acute thoracic abnormality. Aortic atherosclerosis (ICD10-I70.0). Electronically Signed   By: Ulyses Jarred M.D.   On: 09/05/2018 17:32    Pending Labs Unresulted Labs (From admission, onward)    Start     Ordered   09/05/18 1830  Troponin I - ONCE - STAT  ONCE - STAT,   STAT     09/05/18 1657   Signed and Held  HIV antibody (Routine Testing)  Once,   R     Signed and Held   Signed and Held  Troponin I - Now Then Q3H  Now then every 3 hours,   TIMED     Signed and Held   Signed and Held  CBC  (enoxaparin (LOVENOX) full dose)  Once,   R    Comments:  Baseline for enoxaparin therapy IF NOT ALREADY DRAWN.  Notify MD if PLT < 100 K.    Signed and Held   Signed and Held  Creatinine, serum  (enoxaparin (LOVENOX) full dose)  Once,   R    Comments:  Baseline for  enoxaparin therapy IF NOT ALREADY DRAWN.    Signed and Held   Signed and Held  Creatinine, serum  (enoxaparin (LOVENOX) full dose)  Weekly,   R    Comments:  while on enoxaparin therapy.    Signed and Held          Vitals/Pain Today's Vitals   09/05/18 1625 09/05/18 1645 09/05/18 1745 09/05/18 1800  BP: 128/63 (!) 141/61 135/68 (!) 129/57  Pulse: 78 84 89 76  Resp: 17 10 16 14   Temp:      TempSrc:      SpO2: 95% 100% 100% 100%  Weight:      Height:      PainSc:        Isolation Precautions No active isolations  Medications Medications  nitroGLYCERIN (NITROSTAT) SL tablet 0.4 mg (0.4 mg Sublingual Given 09/05/18 1623)  aspirin chewable tablet 324 mg (324 mg Oral Given 09/05/18 1654)  iohexol (OMNIPAQUE) 350 MG/ML injection 80 mL (53 mLs Intravenous Contrast Given 09/05/18 1706)    Mobility walks     Focused Assessments Cardiac Assessment Handoff:  Cardiac Rhythm: Normal sinus rhythm Lab Results  Component Value Date   TROPONINI <0.03 09/05/2018   Lab Results  Component Value Date   DDIMER 0.70 (H) 09/05/2018   Does the Patient currently have chest pain? Yes     R Recommendations: See Admitting Provider Note  Report given to:   Additional Notes: Pt A&Ox4, GCS 15. Independent at home, pt reports onset of CP 2 days PTA, states intermittently present.

## 2018-09-05 NOTE — ED Provider Notes (Signed)
[facility] EMERGENCY DEPARTMENT Provider Note   CSN: [id number] Arrival date & time: 09/05/18  1458    History   Chief Complaint Chief Complaint  Patient presents with  . Chest Pain    HPI Maureen Bishop is a 68 y.o. female presenting for evaluation of chest pain.   Patient states she has not been feeling well for the past 2 to 3 weeks.  Last week, she started developed right ear pain, she was seen at urgent care 3 days ago, diagnosed with a right ear infection and started on amoxicillin.  Patient states 2 days ago, she started to have central chest pain, which she describes as a severe indigestion but deeper.  It is constant, worse with exertion.  Chest pain worsened today, causing tingling going down her left arm.  Patient denies history of reflux or GERD.  Patient states she feels like she needs to take a deeper breath every few breaths, like there is a blockage.  Otherwise, she denies other shortness of breath.  She reports temperature high normal recently, T-max 99.7.  She denies cough, sore throat, nasal congestion.  She denies nausea, vomiting, abdominal pain, urinary symptoms, abnormal bowel movements.  She denies recent travel, surgeries, mobilization, history of cancer, history of previous DVT/PE, or hormone use.  She denies personal history of cardiac problems, her dad died of a heart attack at 15 years old.  Patient with a history of hypertension and borderline diabetes for which she is currently being treated.  She denies tobacco, alcohol, or drug use.  She denies sick contacts in her family.  HPI  Past Medical History:  Diagnosis Date  . Diabetes mellitus without complication ([place])   . Hypertension     Patient Active Problem List   Diagnosis Date Noted  . Chest pain 09/05/2018  . Benign essential HTN 09/05/2018  . Type 2 diabetes mellitus without complication ([place]) 96/29/5284  . Hyperlipidemia 09/05/2018    Past Surgical History:  Procedure  Laterality Date  . CESAREAN SECTION       OB History    Gravida  2   Para  2   Term      Preterm      AB      Living  2     SAB      TAB      Ectopic      Multiple      Live Births               Home Medications    Prior to Admission medications   Medication Sig Start Date End Date Taking? Authorizing Provider  acetaminophen (TYLENOL) 500 MG tablet Take 1,000 mg by mouth at bedtime.    Yes [provider]  amLODipine (NORVASC) 5 MG tablet Take 5 mg by mouth at bedtime.  06/16/18  Yes [provider]  amoxicillin-clavulanate (AUGMENTIN) 875-125 MG tablet Take 1 tablet by mouth 2 (two) times daily. FOR 10 DAYS 09/02/18  Yes [provider]  naproxen (NAPROSYN) 500 MG tablet Take 500 mg by mouth 2 (two) times daily as needed (for pain).   Yes [provider]  pregabalin (LYRICA) 50 MG capsule Take 50-100 mg by mouth See admin instructions. Take 50 mg by mouth in the morning and 100 mg at bedtime   Yes [provider]  SitaGLIPtin-MetFORMIN HCl (JANUMET XR) 4302077232 MG TB24 Take 1 tablet by mouth at bedtime.   Yes [provider]  valsartan-hydrochlorothiazide (  DIOVAN-HCT) 320-12.5 MG tablet Take 1 tablet by mouth at bedtime.   Yes [provider]  oxyCODONE-acetaminophen (PERCOCET/ROXICET) 5-325 MG per tablet Take 1-2 tablets by mouth every 6 (six) hours as needed for pain. Patient not taking: Reported on 09/05/2018 01/12/13   Davonna Belling, MD    Family History No family history on file.  Social History Social History   Tobacco Use  . Smoking status: Former Research scientist (life sciences)  . Smokeless tobacco: Never Used  Substance Use Topics  . Alcohol use: Yes    Alcohol/week: 10.0 standard drinks    Types: 10 Glasses of wine per week  . Drug use: No     Allergies   Sulfa antibiotics and Sulfasalazine   Review of Systems Review of Systems  Respiratory: Positive for shortness of breath.   Cardiovascular:  Positive for chest pain.  All other systems reviewed and are negative.    Physical Exam Updated Vital Signs BP (!) 150/69   Pulse 79   Temp 98 F (36.7 C) (Oral)   Resp 13   Ht 5' (1.524 m)   Wt 73 kg   SpO2 98%   BMI 31.44 kg/m   Physical Exam Vitals signs and nursing note reviewed.  Constitutional:      General: She is not in acute distress.    Appearance: She is well-developed.     Comments: Appears nontoxic  HENT:     Head: Normocephalic and atraumatic.  Eyes:     Conjunctiva/sclera: Conjunctivae normal.     Pupils: Pupils are equal, round, and reactive to light.  Neck:     Musculoskeletal: Normal range of motion and neck supple.  Cardiovascular:     Rate and Rhythm: Normal rate and regular rhythm.     Pulses: Normal pulses.  Pulmonary:     Effort: Pulmonary effort is normal. No respiratory distress.     Breath sounds: Normal breath sounds. No wheezing.     Comments: Speaking in full sentences Chest:     Chest wall: No tenderness.  Abdominal:     General: There is no distension.     Palpations: Abdomen is soft. There is no mass.     Tenderness: There is no abdominal tenderness. There is no guarding or rebound.  Musculoskeletal: Normal range of motion.     Comments: No leg pain or swelling  Skin:    General: Skin is warm and dry.     Capillary Refill: Capillary refill takes less than 2 seconds.  Neurological:     Mental Status: She is alert and oriented to person, place, and time.      ED Treatments / Results  Labs (all labs ordered are listed, but only abnormal results are displayed) Labs Reviewed  CBC WITH DIFFERENTIAL/PLATELET - Abnormal; Notable for the following components:      Result Value   Hemoglobin 10.0 (*)    HCT 32.2 (*)    MCV 73.3 (*)    MCH 22.8 (*)    RDW 16.2 (*)    All other components within normal limits  COMPREHENSIVE METABOLIC PANEL - Abnormal; Notable for the following components:   Glucose, Bld 101 (*)    All other  components within normal limits  D-DIMER, QUANTITATIVE (NOT AT [facility]) - Abnormal; Notable for the following components:   D-Dimer, Quant 0.70 (*)    All other components within normal limits  LIPASE, BLOOD  TROPONIN I  TROPONIN I    EKG EKG Interpretation  Date/Time:  Wednesday September 05 2018 15:03:51 EDT Ventricular Rate:  80 PR Interval:  164 QRS Duration: 58 QT Interval:  346 QTC Calculation: 399 R Axis:   78 Text Interpretation:  Normal sinus rhythm Low voltage QRS Nonspecific ST abnormality Abnormal ECG No significant change since last tracing Confirmed by Duffy Bruce [phone]) on 09/05/2018 3:46:59 PM   Radiology Dg Chest 2 View  Result Date: 09/05/2018 CLINICAL DATA:  68 year old female with chest pain EXAM: CHEST - 2 VIEW COMPARISON:  10/27/2017 FINDINGS: Cardiomediastinal silhouette within normal limits in size and contour. No evidence of central vascular congestion. No pneumothorax or pleural effusion. No confluent airspace disease. No displaced fracture. IMPRESSION: Negative for acute cardiopulmonary disease Electronically Signed   By: Corrie Mckusick D.O.   On: 09/05/2018 16:04   Ct Angio Chest Pe W And/or Wo Contrast  Result Date: 09/05/2018 CLINICAL DATA:  Shortness of breath EXAM: CT ANGIOGRAPHY CHEST WITH CONTRAST TECHNIQUE: Multidetector CT imaging of the chest was performed using the standard protocol during bolus administration of intravenous contrast. Multiplanar CT image reconstructions and MIPs were obtained to evaluate the vascular anatomy. CONTRAST:  3mL OMNIPAQUE IOHEXOL 350 MG/ML SOLN COMPARISON:  None. FINDINGS: Cardiovascular: --Pulmonary arteries: Contrast injection is sufficient to demonstrate satisfactory opacification of the pulmonary arteries to the segmental level, with attenuation of 315 HU at the main pulmonary artery. There is no pulmonary embolus. The main pulmonary artery is within normal limits for size. --Aorta: Satisfactory opacification of the  thoracic aorta. No aortic dissection or other acute aortic syndrome. Conventional 3 vessel aortic branching pattern. The aortic course and caliber are normal. There is mild aortic atherosclerosis. --Heart: Normal size. No pericardial effusion. Mediastinum/Nodes: No mediastinal, hilar or axillary lymphadenopathy. The visualized thyroid and thoracic esophageal course are unremarkable. Lungs/Pleura: No pulmonary nodules or masses. No pleural effusion or pneumothorax. No focal airspace consolidation. No focal pleural abnormality. Upper Abdomen: Contrast bolus timing is not optimized for evaluation of the abdominal organs. Within this limitation, the visualized organs of the upper abdomen are normal. Musculoskeletal: No chest wall abnormality. No acute or significant osseous findings. Review of the MIP images confirms the above findings. IMPRESSION: No pulmonary embolus or other acute thoracic abnormality. Aortic atherosclerosis (ICD10-I70.0). Electronically Signed   By: Ulyses Jarred M.D.   On: 09/05/2018 17:32    Procedures Procedures (including critical care time)  Medications Ordered in ED Medications  nitroGLYCERIN (NITROSTAT) SL tablet 0.4 mg (0.4 mg Sublingual Given 09/05/18 1623)  aspirin chewable tablet 324 mg (324 mg Oral Given 09/05/18 1654)  iohexol (OMNIPAQUE) 350 MG/ML injection 80 mL (53 mLs Intravenous Contrast Given 09/05/18 1706)     Initial Impression / Assessment and Plan / ED Course  I have reviewed the triage vital signs and the nursing notes.  Pertinent labs & imaging results that were available during my care of the patient were reviewed by me and considered in my medical decision making (see chart for details).     Pt presenting for evaluation of chest pain.  Well history is not classic, she is high risk considering her age, diabetes, hypertension, lack of previous cardiac work-up.  Family history positive.  Heart score of 6.  Will obtain labs, EKG, chest x-ray, reassess.   Patient is not PERC negative due to age.  Low risk for PE, however patient is describing a blockage when she is trying to breathe.  Will obtain ddimer.  Labs reassuring, initial troponin negative.  EKG unchanged from previous.  X-ray viewed interpreted by me, no pneumonia,  pneumothorax, effusion, cardiomegaly.  Patient reports pain improved with nitro.  Dimer slightly positive at 0.7.  Doubt PE, however will obtain CTA for further evaluation.  Discussed with attending, Dr. Ellender Hose evaluated the patient.  Will call for admission.  CTA negative for PE.  Discussed with Dr. Mcarthur Rossetti from [facility], patient to be admitted.   Final Clinical Impressions(s) / ED Diagnoses   Final diagnoses:  Atypical chest pain    ED Discharge Orders    None       Franchot Heidelberg, PA-C 09/05/18 1934    Duffy Bruce, MD 09/06/18 1448

## 2018-09-05 NOTE — ED Triage Notes (Signed)
Pt with left side chest pain with radiation to the left arm since yesterday. Hx of diabetes and HTN.  She denies shortness of breath. A/o NAD at triage.

## 2018-09-06 ENCOUNTER — Encounter (HOSPITAL_BASED_OUTPATIENT_CLINIC_OR_DEPARTMENT_OTHER): Payer: Self-pay

## 2018-09-06 ENCOUNTER — Encounter (HOSPITAL_COMMUNITY)
Admission: EM | Disposition: A | Discharge status: Home / Self Care | Payer: Self-pay | Source: Home / Self Care | Attending: Emergency Medicine

## 2018-09-06 ENCOUNTER — Ambulatory Visit (HOSPITAL_BASED_OUTPATIENT_CLINIC_OR_DEPARTMENT_OTHER): Admit: 2018-09-06 | Payer: Medicare HMO | Admitting: General Surgery

## 2018-09-06 ENCOUNTER — Encounter (HOSPITAL_COMMUNITY): Payer: Self-pay | Admitting: Cardiology

## 2018-09-06 DIAGNOSIS — I2584 Coronary atherosclerosis due to calcified coronary lesion: Secondary | ICD-10-CM | POA: Diagnosis not present

## 2018-09-06 DIAGNOSIS — I10 Essential (primary) hypertension: Secondary | ICD-10-CM | POA: Diagnosis not present

## 2018-09-06 DIAGNOSIS — I25118 Atherosclerotic heart disease of native coronary artery with other forms of angina pectoris: Secondary | ICD-10-CM | POA: Diagnosis not present

## 2018-09-06 DIAGNOSIS — Z8249 Family history of ischemic heart disease and other diseases of the circulatory system: Secondary | ICD-10-CM

## 2018-09-06 DIAGNOSIS — R0789 Other chest pain: Secondary | ICD-10-CM

## 2018-09-06 DIAGNOSIS — Z794 Long term (current) use of insulin: Secondary | ICD-10-CM | POA: Diagnosis not present

## 2018-09-06 DIAGNOSIS — I2510 Atherosclerotic heart disease of native coronary artery without angina pectoris: Secondary | ICD-10-CM

## 2018-09-06 DIAGNOSIS — I200 Unstable angina: Secondary | ICD-10-CM | POA: Diagnosis not present

## 2018-09-06 DIAGNOSIS — I700 Atherosclerosis of aorta: Secondary | ICD-10-CM | POA: Diagnosis not present

## 2018-09-06 DIAGNOSIS — Z882 Allergy status to sulfonamides status: Secondary | ICD-10-CM | POA: Diagnosis not present

## 2018-09-06 DIAGNOSIS — E782 Mixed hyperlipidemia: Secondary | ICD-10-CM

## 2018-09-06 DIAGNOSIS — I25110 Atherosclerotic heart disease of native coronary artery with unstable angina pectoris: Secondary | ICD-10-CM | POA: Diagnosis not present

## 2018-09-06 DIAGNOSIS — E785 Hyperlipidemia, unspecified: Secondary | ICD-10-CM | POA: Diagnosis not present

## 2018-09-06 DIAGNOSIS — E119 Type 2 diabetes mellitus without complications: Secondary | ICD-10-CM | POA: Diagnosis not present

## 2018-09-06 DIAGNOSIS — Z87891 Personal history of nicotine dependence: Secondary | ICD-10-CM | POA: Diagnosis not present

## 2018-09-06 HISTORY — PX: LEFT HEART CATH AND CORONARY ANGIOGRAPHY: CATH118249

## 2018-09-06 HISTORY — PX: INTRAVASCULAR PRESSURE WIRE/FFR STUDY: CATH118243

## 2018-09-06 LAB — CBC
HCT: 32.3 % — ABNORMAL LOW (ref 36.0–46.0)
Hemoglobin: 10 g/dL — ABNORMAL LOW (ref 12.0–15.0)
MCH: 22.1 pg — ABNORMAL LOW (ref 26.0–34.0)
MCHC: 31 g/dL (ref 30.0–36.0)
MCV: 71.5 fL — ABNORMAL LOW (ref 80.0–100.0)
Platelets: 320 10*3/uL (ref 150–400)
RBC: 4.52 MIL/uL (ref 3.87–5.11)
RDW: 15.9 % — ABNORMAL HIGH (ref 11.5–15.5)
WBC: 9.9 10*3/uL (ref 4.0–10.5)
nRBC: 0 % (ref 0.0–0.2)

## 2018-09-06 LAB — GLUCOSE, CAPILLARY
Glucose-Capillary: 108 mg/dL — ABNORMAL HIGH (ref 70–99)
Glucose-Capillary: 103 mg/dL — ABNORMAL HIGH (ref 70–99)
Glucose-Capillary: 105 mg/dL — ABNORMAL HIGH (ref 70–99)
Glucose-Capillary: 91 mg/dL (ref 70–99)
Glucose-Capillary: 106 mg/dL — ABNORMAL HIGH (ref 70–99)

## 2018-09-06 LAB — TROPONIN I
Troponin I: <0.03 ng/mL (ref <0.03)
Troponin I: <0.03 ng/mL (ref <0.03)
Troponin I: <0.03 ng/mL (ref <0.03)

## 2018-09-06 LAB — BASIC METABOLIC PANEL
Anion gap: 10 (ref 5–15)
BUN: 8 mg/dL (ref 8–23)
CO2: 21 mmol/L — ABNORMAL LOW (ref 22–32)
Calcium: 9.2 mg/dL (ref 8.9–10.3)
Chloride: 108 mmol/L (ref 98–111)
Creatinine, Ser: 0.79 mg/dL (ref 0.44–1.00)
GFR calc Af Amer: >60 mL/min (ref >60)
GFR calc non Af Amer: >60 mL/min (ref >60)
Glucose, Bld: 100 mg/dL — ABNORMAL HIGH (ref 70–99)
Potassium: 3.7 mmol/L (ref 3.5–5.1)
Sodium: 139 mmol/L (ref 135–145)

## 2018-09-06 LAB — LIPID PANEL
Cholesterol: 194 mg/dL (ref 0–200)
HDL: 46 mg/dL (ref >40)
LDL Cholesterol: 131 mg/dL — ABNORMAL HIGH (ref 0–99)
Total CHOL/HDL Ratio: 4.2 RATIO
Triglycerides: 86 mg/dL (ref <150)
VLDL: 17 mg/dL (ref 0–40)

## 2018-09-06 LAB — POCT ACTIVATED CLOTTING TIME: Activated Clotting Time: 373 seconds

## 2018-09-06 LAB — CREATININE, SERUM
Creatinine, Ser: 0.81 mg/dL (ref 0.44–1.00)
GFR calc Af Amer: >60 mL/min (ref >60)
GFR calc non Af Amer: >60 mL/min (ref >60)

## 2018-09-06 LAB — HEMOGLOBIN A1C
Hgb A1c MFr Bld: 6.1 % — ABNORMAL HIGH (ref 4.8–5.6)
Mean Plasma Glucose: 128.37 mg/dL

## 2018-09-06 LAB — HIV ANTIBODY (ROUTINE TESTING W REFLEX): HIV Screen 4th Generation wRfx: NONREACTIVE

## 2018-09-06 SURGERY — HEMORRHOIDECTOMY
Anesthesia: Monitor Anesthesia Care

## 2018-09-06 SURGERY — LEFT HEART CATH AND CORONARY ANGIOGRAPHY
Anesthesia: LOCAL

## 2018-09-06 MED ORDER — HEPARIN (PORCINE) IN NACL 1000-0.9 UT/500ML-% IV SOLN
INTRAVENOUS | Status: AC
  Filled 2018-09-06: qty 1500

## 2018-09-06 MED ORDER — AMLODIPINE BESYLATE 5 MG PO TABS: 5.0000 mg | ORAL_TABLET | Freq: Every day | ORAL | Status: DC

## 2018-09-06 MED ORDER — ADENOSINE (DIAGNOSTIC) 140MCG/KG/MIN
INTRAVENOUS | Status: DC | PRN
  Administered 2018-09-06: 140 ug/kg/min via INTRAVENOUS

## 2018-09-06 MED ORDER — SODIUM CHLORIDE 0.9% FLUSH: 3.0000 mL | INTRAVENOUS | Status: DC | PRN

## 2018-09-06 MED ORDER — SODIUM CHLORIDE 0.9 % WEIGHT BASED INFUSION
3.0000 mL/kg/h | INTRAVENOUS | Status: DC
  Administered 2018-09-06: 3 mL/kg/h via INTRAVENOUS

## 2018-09-06 MED ORDER — HEPARIN SODIUM (PORCINE) 1000 UNIT/ML IJ SOLN
INTRAMUSCULAR | Status: AC
  Filled 2018-09-06: qty 1

## 2018-09-06 MED ORDER — SODIUM CHLORIDE 0.9 % IV SOLN
INTRAVENOUS | Status: DC | PRN
  Administered 2018-09-06: 1.75 mg/kg/h via INTRAVENOUS

## 2018-09-06 MED ORDER — HYDROCHLOROTHIAZIDE 12.5 MG PO CAPS
12.5000 mg | ORAL_CAPSULE | Freq: Every day | ORAL | Status: DC
  Administered 2018-09-07: 12.5 mg via ORAL
  Filled 2018-09-06: qty 1

## 2018-09-06 MED ORDER — ACETAMINOPHEN 325 MG PO TABS
650.0000 mg | ORAL_TABLET | ORAL | Status: DC | PRN
  Administered 2018-09-07: 650 mg via ORAL
  Filled 2018-09-06: qty 2

## 2018-09-06 MED ORDER — HYDRALAZINE HCL 20 MG/ML IJ SOLN: 10.0000 mg | INTRAMUSCULAR | Status: AC | PRN

## 2018-09-06 MED ORDER — LIDOCAINE HCL (PF) 1 % IJ SOLN
INTRAMUSCULAR | Status: DC | PRN
  Administered 2018-09-06: 2 mL
  Administered 2018-09-06: 18 mL

## 2018-09-06 MED ORDER — AMLODIPINE BESYLATE 2.5 MG PO TABS
2.5000 mg | ORAL_TABLET | Freq: Once | ORAL | Status: AC
  Administered 2018-09-06: 2.5 mg via ORAL
  Filled 2018-09-06: qty 1

## 2018-09-06 MED ORDER — SODIUM CHLORIDE 0.9 % WEIGHT BASED INFUSION: 3.0000 mL/kg/h | INTRAVENOUS | Status: DC

## 2018-09-06 MED ORDER — CLOPIDOGREL BISULFATE 300 MG PO TABS
ORAL_TABLET | ORAL | Status: DC | PRN
  Administered 2018-09-06: 300 mg via ORAL

## 2018-09-06 MED ORDER — ATROPINE SULFATE 1 MG/10ML IJ SOSY
PREFILLED_SYRINGE | INTRAMUSCULAR | Status: AC
  Filled 2018-09-06: qty 10

## 2018-09-06 MED ORDER — MIDAZOLAM HCL 2 MG/2ML IJ SOLN
INTRAMUSCULAR | Status: DC | PRN
  Administered 2018-09-06: 1 mg via INTRAVENOUS

## 2018-09-06 MED ORDER — IRBESARTAN 150 MG PO TABS
300.0000 mg | ORAL_TABLET | Freq: Every day | ORAL | Status: DC
  Administered 2018-09-07: 300 mg via ORAL
  Filled 2018-09-06 (×2): qty 2

## 2018-09-06 MED ORDER — HEPARIN (PORCINE) IN NACL 1000-0.9 UT/500ML-% IV SOLN
INTRAVENOUS | Status: DC | PRN
  Administered 2018-09-06 (×2): 500 mL

## 2018-09-06 MED ORDER — MIDAZOLAM HCL 2 MG/2ML IJ SOLN
INTRAMUSCULAR | Status: AC
  Filled 2018-09-06: qty 2

## 2018-09-06 MED ORDER — SODIUM CHLORIDE 0.9 % WEIGHT BASED INFUSION: 1.0000 mL/kg/h | INTRAVENOUS | Status: DC

## 2018-09-06 MED ORDER — ONDANSETRON HCL 4 MG/2ML IJ SOLN: 4.0000 mg | Freq: Four times a day (QID) | INTRAMUSCULAR | Status: DC | PRN

## 2018-09-06 MED ORDER — HEPARIN SODIUM (PORCINE) 1000 UNIT/ML IJ SOLN
INTRAMUSCULAR | Status: DC | PRN
  Administered 2018-09-06: 3000 [IU] via INTRAVENOUS

## 2018-09-06 MED ORDER — ASPIRIN 81 MG PO CHEW
81.0000 mg | CHEWABLE_TABLET | ORAL | Status: AC
  Administered 2018-09-06: 81 mg via ORAL
  Filled 2018-09-06: qty 1

## 2018-09-06 MED ORDER — BIVALIRUDIN BOLUS VIA INFUSION - CUPID
INTRAVENOUS | Status: DC | PRN
  Administered 2018-09-06: 54.45 mg via INTRAVENOUS

## 2018-09-06 MED ORDER — IOHEXOL 350 MG/ML SOLN
INTRAVENOUS | Status: DC | PRN
  Administered 2018-09-06: 15:00:00 120 mL via INTRA_ARTERIAL

## 2018-09-06 MED ORDER — ADENOSINE 12 MG/4ML IV SOLN
INTRAVENOUS | Status: AC
  Filled 2018-09-06: qty 16

## 2018-09-06 MED ORDER — BIVALIRUDIN TRIFLUOROACETATE 250 MG IV SOLR
INTRAVENOUS | Status: AC
  Filled 2018-09-06: qty 250

## 2018-09-06 MED ORDER — ENOXAPARIN SODIUM 40 MG/0.4ML ~~LOC~~ SOLN
40.0000 mg | SUBCUTANEOUS | Status: DC
  Administered 2018-09-07: 40 mg via SUBCUTANEOUS
  Filled 2018-09-06: qty 0.4

## 2018-09-06 MED ORDER — ATORVASTATIN CALCIUM 80 MG PO TABS
80.0000 mg | ORAL_TABLET | ORAL | Status: AC
  Administered 2018-09-06: 80 mg via ORAL

## 2018-09-06 MED ORDER — PREGABALIN 25 MG PO CAPS: 50.0000 mg | ORAL_CAPSULE | ORAL | Status: DC

## 2018-09-06 MED ORDER — LIDOCAINE HCL (PF) 1 % IJ SOLN
INTRAMUSCULAR | Status: AC
  Filled 2018-09-06: qty 30

## 2018-09-06 MED ORDER — AMLODIPINE BESYLATE 2.5 MG PO TABS
2.5000 mg | ORAL_TABLET | Freq: Every day | ORAL | Status: DC
  Administered 2018-09-06: 2.5 mg via ORAL
  Filled 2018-09-06: qty 1

## 2018-09-06 MED ORDER — PREGABALIN 25 MG PO CAPS
50.0000 mg | ORAL_CAPSULE | Freq: Every day | ORAL | Status: DC
  Administered 2018-09-07: 50 mg via ORAL
  Filled 2018-09-06: qty 2

## 2018-09-06 MED ORDER — VERAPAMIL HCL 2.5 MG/ML IV SOLN
INTRAVENOUS | Status: AC
  Filled 2018-09-06: qty 2

## 2018-09-06 MED ORDER — PREGABALIN 100 MG PO CAPS
100.0000 mg | ORAL_CAPSULE | Freq: Every day | ORAL | Status: DC
  Administered 2018-09-06: 100 mg via ORAL
  Filled 2018-09-06: qty 1

## 2018-09-06 MED ORDER — SODIUM CHLORIDE 0.9 % IV SOLN: 250.0000 mL | INTRAVENOUS | Status: DC | PRN

## 2018-09-06 MED ORDER — FENTANYL CITRATE (PF) 100 MCG/2ML IJ SOLN
INTRAMUSCULAR | Status: AC
  Filled 2018-09-06: qty 2

## 2018-09-06 MED ORDER — ASPIRIN 81 MG PO CHEW: 81.0000 mg | CHEWABLE_TABLET | ORAL | Status: DC

## 2018-09-06 MED ORDER — CLOPIDOGREL BISULFATE 75 MG PO TABS
75.0000 mg | ORAL_TABLET | Freq: Every day | ORAL | Status: DC
  Administered 2018-09-07: 75 mg via ORAL
  Filled 2018-09-06: qty 1

## 2018-09-06 MED ORDER — ATORVASTATIN CALCIUM 40 MG PO TABS
40.0000 mg | ORAL_TABLET | Freq: Every day | ORAL | Status: DC
  Administered 2018-09-06: 40 mg via ORAL
  Filled 2018-09-06 (×3): qty 1

## 2018-09-06 MED ORDER — LABETALOL HCL 5 MG/ML IV SOLN: 10.0000 mg | INTRAVENOUS | Status: AC | PRN

## 2018-09-06 MED ORDER — SODIUM CHLORIDE 0.9 % IV SOLN: INTRAVENOUS | Status: AC

## 2018-09-06 MED ORDER — SODIUM CHLORIDE 0.9% FLUSH
3.0000 mL | Freq: Two times a day (BID) | INTRAVENOUS | Status: DC
  Administered 2018-09-06: 3 mL via INTRAVENOUS

## 2018-09-06 MED ORDER — FENTANYL CITRATE (PF) 100 MCG/2ML IJ SOLN
INTRAMUSCULAR | Status: DC | PRN
  Administered 2018-09-06: 25 ug via INTRAVENOUS

## 2018-09-06 MED ORDER — VERAPAMIL HCL 2.5 MG/ML IV SOLN
INTRA_ARTERIAL | Status: DC | PRN
  Administered 2018-09-06: 15 mL via INTRA_ARTERIAL

## 2018-09-06 MED ORDER — HEPARIN (PORCINE) IN NACL 1000-0.9 UT/500ML-% IV SOLN
INTRAVENOUS | Status: DC | PRN
  Administered 2018-09-06: 500 mL

## 2018-09-06 MED ORDER — CLOPIDOGREL BISULFATE 300 MG PO TABS
ORAL_TABLET | ORAL | Status: AC
  Filled 2018-09-06: qty 1

## 2018-09-06 MED ORDER — SODIUM CHLORIDE 0.9% FLUSH
3.0000 mL | Freq: Two times a day (BID) | INTRAVENOUS | Status: DC
  Administered 2018-09-07: 3 mL via INTRAVENOUS

## 2018-09-06 MED ORDER — METOPROLOL TARTRATE 25 MG PO TABS
25.0000 mg | ORAL_TABLET | Freq: Two times a day (BID) | ORAL | Status: DC
  Administered 2018-09-06 – 2018-09-07 (×2): 25 mg via ORAL
  Filled 2018-09-06 (×2): qty 1

## 2018-09-06 MED ORDER — MORPHINE SULFATE (PF) 2 MG/ML IV SOLN: 2.0000 mg | INTRAVENOUS | Status: DC | PRN

## 2018-09-06 MED ORDER — ATORVASTATIN CALCIUM 80 MG PO TABS
80.0000 mg | ORAL_TABLET | Freq: Every day | ORAL | Status: DC
  Administered 2018-09-07: 80 mg via ORAL
  Filled 2018-09-06: qty 1

## 2018-09-06 MED ORDER — VALSARTAN-HYDROCHLOROTHIAZIDE 320-12.5 MG PO TABS: 1.0000 | ORAL_TABLET | Freq: Every day | ORAL | Status: DC

## 2018-09-06 SURGICAL SUPPLY — 19 items
CATH INFINITI 5FR MULTPACK ANG (CATHETERS) ×1 IMPLANT
CATH OPTITORQUE TIG 4.0 5F (CATHETERS) ×1 IMPLANT
CATH VISTA GUIDE 6FR XBLAD3.5 (CATHETERS) ×1 IMPLANT
COVER DOME SNAP 22 D (MISCELLANEOUS) ×1 IMPLANT
DEVICE RAD COMP TR BAND LRG (VASCULAR PRODUCTS) ×1 IMPLANT
GLIDESHEATH SLEND A-KIT 6F 22G (SHEATH) ×1 IMPLANT
GUIDEWIRE INQWIRE 1.5J.035X260 (WIRE) IMPLANT
GUIDEWIRE PRESSURE COMET II (WIRE) ×1 IMPLANT
INQWIRE 1.5J .035X260CM (WIRE) ×2
KIT ESSENTIALS PG (KITS) ×1 IMPLANT
KIT HEART LEFT (KITS) ×2 IMPLANT
PACK CARDIAC CATHETERIZATION (CUSTOM PROCEDURE TRAY) ×2 IMPLANT
SHEATH PINNACLE 5F 10CM (SHEATH) ×1 IMPLANT
SHEATH PINNACLE 6F 10CM (SHEATH) ×1 IMPLANT
SYR MEDRAD MARK 7 150ML (SYRINGE) ×2 IMPLANT
TRANSDUCER W/STOPCOCK (MISCELLANEOUS) ×2 IMPLANT
TUBING CIL FLEX 10 FLL-RA (TUBING) ×2 IMPLANT
WIRE EMERALD 3MM-J .035X150CM (WIRE) ×1 IMPLANT
WIRE HI TORQ VERSACORE-J 145CM (WIRE) ×1 IMPLANT

## 2018-09-06 NOTE — H&P (View-Only) (Signed)
Cardiology Consultation:   Patient ID: [name] MRN: [id number]; DOB: 1950-08-18  Admit date: 09/05/2018 Date of Consult: 09/06/2018  Primary Care Provider: Lucianne Lei, MD Primary Cardiologist: Maureen Bishop Primary Electrophysiologist:  None   Patient Profile:   Maureen Bishop is a 68 y.o. female with a hx of HTN, DM, HLD, former smoker and family h/o premature CAD who is being seen today for the evaluation of chest pain at the request of Dr. Reesa Bishop, Internal Medicine.   History of Present Illness:   Maureen Bishop is a 68 y.o. female with a hx of HTN, DM, HLD, former smoker and family h/o premature CAD who is being seen today for the evaluation of chest pain at the request of Dr. Reesa Bishop, Internal Medicine.   She has no prior cardiac history but family is notable for premature CAD in 1st degree relative. Her father had a MI at the age of 69.   She has known HTN and DM. She is on amlodipine and valsartan-hctz as an outpatient and takes sitagliptin-metformin for DM. She has HLD but not currently on statin therapy. Lipid panel done today shows elevated LDL at 131 mg/dL. Hgb A1c 6.1. BP controlled this am in the 423N systolic. She is a former smoker.   She presented to the [facility] ED last night w/ CC of chest pain. Intermittent left sided CP w/ radiation to arm x 2 days. Associated w/ dyspnea. Had relief in the ED w/ SL NTG. EKG showed NSR w/ nonspecific ST abnormality. Troponin's are negative x 3. Chest CT negative for PE. Mild aortic calcification noted. No mention of coronary artery calcifications. CXR unremarkable.   Past Medical History:  Diagnosis Date   Diabetes mellitus without complication ([place])    Hypertension     Past Surgical History:  Procedure Laterality Date   CESAREAN SECTION       Home Medications:  Prior to Admission medications   Medication Sig Start Date End Date Taking? Authorizing Provider  acetaminophen (TYLENOL) 500 MG tablet Take 1,000 mg by mouth at bedtime.     Yes [provider]  amLODipine (NORVASC) 5 MG tablet Take 5 mg by mouth at bedtime.  06/16/18  Yes [provider]  amoxicillin-clavulanate (AUGMENTIN) 875-125 MG tablet Take 1 tablet by mouth 2 (two) times daily. FOR 10 DAYS 09/02/18  Yes [provider]  naproxen (NAPROSYN) 500 MG tablet Take 500 mg by mouth 2 (two) times daily as needed (for pain).   Yes [provider]  pregabalin (LYRICA) 50 MG capsule Take 50-100 mg by mouth See admin instructions. Take 50 mg by mouth in the morning and 100 mg at bedtime   Yes [provider]  SitaGLIPtin-MetFORMIN HCl (JANUMET XR) [phone] MG TB24 Take 1 tablet by mouth at bedtime.   Yes [provider]  valsartan-hydrochlorothiazide (DIOVAN-HCT) 320-12.5 MG tablet Take 1 tablet by mouth at bedtime.   Yes [provider]  oxyCODONE-acetaminophen (PERCOCET/ROXICET) 5-325 MG per tablet Take 1-2 tablets by mouth every 6 (six) hours as needed for pain. Patient not taking: Reported on 09/05/2018 01/12/13   Davonna Belling, MD    Inpatient Medications: Scheduled Meds:  enoxaparin (LOVENOX) injection  1 mg/kg Subcutaneous Q12H   insulin aspart  0-5 Units Subcutaneous QHS   insulin aspart  0-9 Units Subcutaneous TID WC   Continuous Infusions:  PRN Meds: acetaminophen, nitroGLYCERIN, ondansetron (ZOFRAN) IV  Allergies:    Allergies  Allergen Reactions   Sulfa Antibiotics Hives   Sulfasalazine  Hives    Social History:   Social History   Socioeconomic History   Marital status: Widowed    Spouse name: Not on file   Number of children: Not on file   Years of education: Not on file   Highest education level: Not on file  Occupational History   Not on file  Social Needs   Financial resource strain: Not on file   Food insecurity:    Worry: Not on file    Inability: Not on file   Transportation needs:    Medical: Not on file    Non-medical: Not on file  Tobacco Use    Smoking status: Former Smoker   Smokeless tobacco: Never Used  Substance and Sexual Activity   Alcohol use: Yes    Alcohol/week: 10.0 standard drinks    Types: 10 Glasses of wine per week   Drug use: No   Sexual activity: Yes    Birth control/protection: Post-menopausal  Lifestyle   Physical activity:    Days per week: Not on file    Minutes per session: Not on file   Stress: Not on file  Relationships   Social connections:    Talks on phone: Not on file    Gets together: Not on file    Attends religious service: Not on file    Active member of club or organization: Not on file    Attends meetings of clubs or organizations: Not on file    Relationship status: Not on file   Intimate partner violence:    Fear of current or ex partner: Not on file    Emotionally abused: Not on file    Physically abused: Not on file    Forced sexual activity: Not on file  Other Topics Concern   Not on file  Social History Narrative   Not on file    Family History:    Family History  Problem Relation Age of Onset   CAD Father    Heart attack Father        died at 77 y/o from MI      ROS:  Please see the history of present illness.   All other ROS reviewed and negative.     Physical Exam/Data:   Vitals:   09/05/18 1930 09/05/18 2004 09/06/18 0430 09/06/18 0842  BP: (!) 150/69 (!) 159/69 125/68 132/63  Pulse: 79 76 76 78  Resp: 13 18 18    Temp:  98 F (36.7 C) 98 F (36.7 C) 98.1 F (36.7 C)  TempSrc:  Oral  Oral  SpO2: 98% 100% 100% 100%  Weight:   72.6 kg   Height:       No intake or output data in the 24 hours ending 09/06/18 1047 Last 3 Weights 09/06/2018 09/05/2018 06/07/2012  Weight (lbs) 160 lb 1.6 oz 161 lb 164 lb  Weight (kg) 72.621 kg 73.029 kg 74.39 kg     Body mass index is 31.27 kg/m.  General:  Well nourished, well developed, in no acute distress HEENT: normal Lymph: no adenopathy Neck: no JVD Endocrine:  No thryomegaly Vascular: No carotid  bruits; FA pulses 2+ bilaterally without bruits  Cardiac:  normal S1, S2; RRR; no murmur  Lungs:  clear to auscultation bilaterally, no wheezing, rhonchi or rales  Abd: soft, nontender, no hepatomegaly  Ext: no edema Musculoskeletal:  No deformities, BUE and BLE strength normal and equal Skin: warm and dry  Neuro:  CNs 2-12 intact, no focal abnormalities noted Psych:  Normal affect   EKG:  The EKG was personally reviewed and demonstrates:  NSR w/ nonspecific ST abnormalities.  Telemetry:  Telemetry was personally reviewed and demonstrates:  SR  Relevant CV Studies: None to date   Laboratory Data:  Chemistry Recent Labs  Lab 09/05/18 1532 09/06/18 0045  NA 140  --   K 4.3  --   CL 107  --   CO2 23  --   GLUCOSE 101*  --   BUN 10  --   CREATININE 0.86 0.81  CALCIUM 9.4  --   GFRNONAA >60 >60  GFRAA >60 >60  ANIONGAP 10  --     Recent Labs  Lab 09/05/18 1532  PROT 7.2  ALBUMIN 3.7  AST 31  ALT 33  ALKPHOS 78  BILITOT 0.5   Hematology Recent Labs  Lab 09/05/18 1532 09/06/18 0045  WBC 9.0 9.9  RBC 4.39 4.52  HGB 10.0* 10.0*  HCT 32.2* 32.3*  MCV 73.3* 71.5*  MCH 22.8* 22.1*  MCHC 31.1 31.0  RDW 16.2* 15.9*  PLT 321 320   Cardiac Enzymes Recent Labs  Lab 09/05/18 1532 09/05/18 1823 09/06/18 0045 09/06/18 0236 09/06/18 0659  TROPONINI <0.03 <0.03 <0.03 <0.03 <0.03   No results for input(s): TROPIPOC in the last 168 hours.  BNPNo results for input(s): BNP, PROBNP in the last 168 hours.  DDimer  Recent Labs  Lab 09/05/18 1532  DDIMER 0.70*    Radiology/Studies:  Dg Chest 2 View  Result Date: 09/05/2018 CLINICAL DATA:  68 year old female with chest pain EXAM: CHEST - 2 VIEW COMPARISON:  10/27/2017 FINDINGS: Cardiomediastinal silhouette within normal limits in size and contour. No evidence of central vascular congestion. No pneumothorax or pleural effusion. No confluent airspace disease. No displaced fracture. IMPRESSION: Negative for acute  cardiopulmonary disease Electronically Signed   By: Corrie Mckusick D.O.   On: 09/05/2018 16:04   Ct Angio Chest Pe W And/or Wo Contrast  Result Date: 09/05/2018 CLINICAL DATA:  Shortness of breath EXAM: CT ANGIOGRAPHY CHEST WITH CONTRAST TECHNIQUE: Multidetector CT imaging of the chest was performed using the standard protocol during bolus administration of intravenous contrast. Multiplanar CT image reconstructions and MIPs were obtained to evaluate the vascular anatomy. CONTRAST:  72mL OMNIPAQUE IOHEXOL 350 MG/ML SOLN COMPARISON:  None. FINDINGS: Cardiovascular: --Pulmonary arteries: Contrast injection is sufficient to demonstrate satisfactory opacification of the pulmonary arteries to the segmental level, with attenuation of 315 HU at the main pulmonary artery. There is no pulmonary embolus. The main pulmonary artery is within normal limits for size. --Aorta: Satisfactory opacification of the thoracic aorta. No aortic dissection or other acute aortic syndrome. Conventional 3 vessel aortic branching pattern. The aortic course and caliber are normal. There is mild aortic atherosclerosis. --Heart: Normal size. No pericardial effusion. Mediastinum/Nodes: No mediastinal, hilar or axillary lymphadenopathy. The visualized thyroid and thoracic esophageal course are unremarkable. Lungs/Pleura: No pulmonary nodules or masses. No pleural effusion or pneumothorax. No focal airspace consolidation. No focal pleural abnormality. Upper Abdomen: Contrast bolus timing is not optimized for evaluation of the abdominal organs. Within this limitation, the visualized organs of the upper abdomen are normal. Musculoskeletal: No chest wall abnormality. No acute or significant osseous findings. Review of the MIP images confirms the above findings. IMPRESSION: No pulmonary embolus or other acute thoracic abnormality. Aortic atherosclerosis (ICD10-I70.0). Electronically Signed   By: Ulyses Jarred M.D.   On: 09/05/2018 17:32     Assessment and Plan:   Maureen Bishop is a 68 y.o.  female with a hx of HTN, DM, untreated HLD, former smoker and family h/o premature CAD who is being seen today for the evaluation of chest pain at the request of Dr. Reesa Bishop, Internal Medicine.   1. Chest Pain:  The patient has been having progressively worsening dyspnea on exertion and shortness of breath at rest with fatigue and exertional chest pain.  Her risk factors include family history of premature coronary artery disease in her father at age 6, hypertension, hyperlipidemia not treated, previous smoking and diabetes.   I have reviewed her chest CTA done yesterday for pulmonary embolism that shows significant coronary calcification in LAD and RCA.   Given that she has significant risk factors she is considered high risk, also she underwent chest CTA yesterday, I do not 1 the risk another CT with use of iodine contrast in case she needs cardiac catheterization. I will arrange for cardiac catheterization today.  Symptomatolgy is concerning for cardiac etiology and she has multiple cardiac risk factors. Troponins are negative x 3. EKG w/ nonspecific ST abnormalities.   2. HLD: not on statin therapy. Lipid panel today shows LDL at 131 mg/dL. Given cardiac risk factors, including HTN and DM, recommend addition of statin. LDL goal to be based on ischemic w/u. If evidence of CAD, LDL goal will be <70 mg/dL. If no CAD, target LDL < 100 mg/dL.   3. HTN: controlled. Continue on home regimen.   4. DM: management per IM.   For questions or updates, please contact [place] Please consult [email or web address] for contact info under   Signed, Ena Dawley, MD 09/06/2018 10:47 AM

## 2018-09-06 NOTE — Progress Notes (Signed)
ACT 169. Sheath pulled at 1727. Pressure held for 20 minutes. Site level 0. Pt resting comfortably.

## 2018-09-06 NOTE — Plan of Care (Signed)
  Problem: Education: Goal: Knowledge of General Education information will improve Description Including pain rating scale, medication(s)/side effects and non-pharmacologic comfort measures Outcome: Progressing   Problem: Health Behavior/Discharge Planning: Goal: Ability to manage health-related needs will improve Outcome: Progressing   Problem: Clinical Measurements: Goal: Ability to maintain clinical measurements within normal limits will improve Outcome: Progressing Goal: Will remain free from infection Outcome: Progressing Goal: Diagnostic test results will improve Outcome: Progressing   Problem: Nutrition: Goal: Adequate nutrition will be maintained Outcome: Progressing   Problem: Elimination: Goal: Will not experience complications related to urinary retention Outcome: Progressing   Problem: Pain Managment: Goal: General experience of comfort will improve Outcome: Progressing   Problem: Safety: Goal: Ability to remain free from injury will improve Outcome: Progressing   Problem: Skin Integrity: Goal: Risk for impaired skin integrity will decrease Outcome: Progressing

## 2018-09-06 NOTE — Progress Notes (Signed)
PROGRESS NOTE    Maureen Bishop  LGX:[id number] DOB: 05/08/1951 DOA: 09/05/2018 PCP: Lucianne Lei, MD   Brief Narrative:  68 year old with history of essential hypertension, diabetes mellitus type 2, hyperlipidemia, history of premature CAD in the family, tobacco use came to the hospital complains of chest pain.   Assessment & Plan:   Principal Problem:   Chest pain Active Problems:   Benign essential HTN   Type 2 diabetes mellitus without complication (HCC)   Hyperlipidemia  Atypical chest pain - Cardiac enzymes are negative, EKG does not show any acute ST-T changes.  Given risk factors, consulted cardiology team for their input in terms of further coronary evaluation.  N.p.o. for now -Currently on Lovenox 75 mcg every 12 hours for ACS protocol -CTA chest negative for PE  Hyperlipidemia -LDL elevated 131.  Will be started on statin therapy moderate versus high intensity?.  Diabetes mellitus type 2 -Hemoglobin A1c in acceptable range, insulin sliding scale. HbA1c 6.1  Essential hypertension -Not on any home medications  DVT prophylaxis: Lovenox Code Status: Full Family Communication: None at bedside Disposition Plan: Maintain inpatient stay until cardiac clearance  Consultants:   Cardiology  Procedures:   None  Antimicrobials:   None   Subjective: States her chest pain is little better this morning but previously has been exertional and at times with deep breath.  Review of Systems Otherwise negative except as per HPI, including: General = no fevers, chills, dizziness, malaise, fatigue HEENT/EYES = negative for pain, redness, loss of vision, double vision, blurred vision, loss of hearing, sore throat, hoarseness, dysphagia Cardiovascular= negative for chest pain, palpitation, murmurs, lower extremity swelling Respiratory/lungs= negative for shortness of breath, cough, hemoptysis, wheezing, mucus production Gastrointestinal= negative for nausea, vomiting,,  abdominal pain, melena, hematemesis Genitourinary= negative for Dysuria, Hematuria, Change in Urinary Frequency MSK = Negative for arthralgia, myalgias, Back Pain, Joint swelling  Neurology= Negative for headache, seizures, numbness, tingling  Psychiatry= Negative for anxiety, depression, suicidal and homocidal ideation Allergy/Immunology= Medication/Food allergy as listed  Skin= Negative for Rash, lesions, ulcers, itching  Objective: Vitals:   09/05/18 1930 09/05/18 2004 09/06/18 0430 09/06/18 0842  BP: (!) 150/69 (!) 159/69 125/68 132/63  Pulse: 79 76 76 78  Resp: 13 18 18    Temp:  98 F (36.7 C) 98 F (36.7 C) 98.1 F (36.7 C)  TempSrc:  Oral  Oral  SpO2: 98% 100% 100% 100%  Weight:   72.6 kg   Height:       No intake or output data in the 24 hours ending 09/06/18 1121 Filed Weights   09/05/18 1504 09/06/18 0430  Weight: 73 kg 72.6 kg    Examination:  General exam: Appears calm and comfortable  Respiratory system: Clear to auscultation. Respiratory effort normal. Cardiovascular system: S1 & S2 heard, RRR. No JVD, murmurs, rubs, gallops or clicks. No pedal edema. Gastrointestinal system: Abdomen is nondistended, soft and nontender. No organomegaly or masses felt. Normal bowel sounds heard. Central nervous system: Alert and oriented. No focal neurological deficits. Extremities: Symmetric 5 x 5 power. Skin: No rashes, lesions or ulcers Psychiatry: Judgement and insight appear normal. Mood & affect appropriate.     Data Reviewed:   CBC: Recent Labs  Lab 09/05/18 1532 09/06/18 0045  WBC 9.0 9.9  NEUTROABS 6.1  --   HGB 10.0* 10.0*  HCT 32.2* 32.3*  MCV 73.3* 71.5*  PLT 321 814   Basic Metabolic Panel: Recent Labs  Lab 09/05/18 1532 09/06/18 0045  NA 140  --  K 4.3  --   CL 107  --   CO2 23  --   GLUCOSE 101*  --   BUN 10  --   CREATININE 0.86 0.81  CALCIUM 9.4  --    GFR: Estimated Creatinine Clearance: 58.3 mL/min (by C-G formula based on SCr of  0.81 mg/dL). Liver Function Tests: Recent Labs  Lab 09/05/18 1532  AST 31  ALT 33  ALKPHOS 78  BILITOT 0.5  PROT 7.2  ALBUMIN 3.7   Recent Labs  Lab 09/05/18 1532  LIPASE 27   No results for input(s): AMMONIA in the last 168 hours. Coagulation Profile: No results for input(s): INR, PROTIME in the last 168 hours. Cardiac Enzymes: Recent Labs  Lab 09/05/18 1532 09/05/18 1823 09/06/18 0045 09/06/18 0236 09/06/18 0659  TROPONINI <0.03 <0.03 <0.03 <0.03 <0.03   BNP (last 3 results) No results for input(s): PROBNP in the last 8760 hours. HbA1C: Recent Labs    09/06/18 0045  HGBA1C 6.1*   CBG: Recent Labs  Lab 09/06/18 0435 09/06/18 0840  GLUCAP 108* 103*   Lipid Profile: Recent Labs    09/06/18 0236  CHOL 194  HDL 46  LDLCALC 131*  TRIG 86  CHOLHDL 4.2   Thyroid Function Tests: No results for input(s): TSH, T4TOTAL, FREET4, T3FREE, THYROIDAB in the last 72 hours. Anemia Panel: No results for input(s): VITAMINB12, FOLATE, FERRITIN, TIBC, IRON, RETICCTPCT in the last 72 hours. Sepsis Labs: No results for input(s): PROCALCITON, LATICACIDVEN in the last 168 hours.  No results found for this or any previous visit (from the past 240 hour(s)).       Radiology Studies: Dg Chest 2 View  Result Date: 09/05/2018 CLINICAL DATA:  68 year old female with chest pain EXAM: CHEST - 2 VIEW COMPARISON:  10/27/2017 FINDINGS: Cardiomediastinal silhouette within normal limits in size and contour. No evidence of central vascular congestion. No pneumothorax or pleural effusion. No confluent airspace disease. No displaced fracture. IMPRESSION: Negative for acute cardiopulmonary disease Electronically Signed   By: Corrie Mckusick D.O.   On: 09/05/2018 16:04   Ct Angio Chest Pe W And/or Wo Contrast  Result Date: 09/05/2018 CLINICAL DATA:  Shortness of breath EXAM: CT ANGIOGRAPHY CHEST WITH CONTRAST TECHNIQUE: Multidetector CT imaging of the chest was performed using the  standard protocol during bolus administration of intravenous contrast. Multiplanar CT image reconstructions and MIPs were obtained to evaluate the vascular anatomy. CONTRAST:  59mL OMNIPAQUE IOHEXOL 350 MG/ML SOLN COMPARISON:  None. FINDINGS: Cardiovascular: --Pulmonary arteries: Contrast injection is sufficient to demonstrate satisfactory opacification of the pulmonary arteries to the segmental level, with attenuation of 315 HU at the main pulmonary artery. There is no pulmonary embolus. The main pulmonary artery is within normal limits for size. --Aorta: Satisfactory opacification of the thoracic aorta. No aortic dissection or other acute aortic syndrome. Conventional 3 vessel aortic branching pattern. The aortic course and caliber are normal. There is mild aortic atherosclerosis. --Heart: Normal size. No pericardial effusion. Mediastinum/Nodes: No mediastinal, hilar or axillary lymphadenopathy. The visualized thyroid and thoracic esophageal course are unremarkable. Lungs/Pleura: No pulmonary nodules or masses. No pleural effusion or pneumothorax. No focal airspace consolidation. No focal pleural abnormality. Upper Abdomen: Contrast bolus timing is not optimized for evaluation of the abdominal organs. Within this limitation, the visualized organs of the upper abdomen are normal. Musculoskeletal: No chest wall abnormality. No acute or significant osseous findings. Review of the MIP images confirms the above findings. IMPRESSION: No pulmonary embolus or other acute thoracic abnormality. Aortic  atherosclerosis (ICD10-I70.0). Electronically Signed   By: Ulyses Jarred M.D.   On: 09/05/2018 17:32        Scheduled Meds: . enoxaparin (LOVENOX) injection  1 mg/kg Subcutaneous Q12H  . insulin aspart  0-5 Units Subcutaneous QHS  . insulin aspart  0-9 Units Subcutaneous TID WC   Continuous Infusions:   LOS: 0 days   Time spent= 25 mins    Shenay Torti Arsenio Loader, MD [facility]  If 7PM-7AM, please  contact night-coverage [email or web address] 09/06/2018, 11:21 AM

## 2018-09-06 NOTE — Interval H&P Note (Signed)
Cath Lab Visit (complete for each Cath Lab visit)  Clinical Evaluation Leading to the Procedure:   ACS: Yes.    Non-ACS:    Anginal Classification: CCS III  Anti-ischemic medical therapy: No Therapy  Non-Invasive Test Results: No non-invasive testing performed  Prior CABG: No previous CABG      History and Physical Interval Note:  09/06/2018 1:50 PM  Maureen Bishop  has presented today for surgery, with the diagnosis of Chest Pain.  The various methods of treatment have been discussed with the patient and family. After consideration of risks, benefits and other options for treatment, the patient has consented to  Procedure(s): LEFT HEART CATH AND CORONARY ANGIOGRAPHY (N/A) as a surgical intervention.  The patient's history has been reviewed, patient examined, no change in status, stable for surgery.  I have reviewed the patient's chart and labs.  Questions were answered to the patient's satisfaction.     Quay Burow

## 2018-09-06 NOTE — Consult Note (Addendum)
Cardiology Consultation:   Patient ID: Maureen Bishop MRN: [id number]; DOB: [birth year]  Admit date: 09/05/2018 Date of Consult: 09/06/2018  Primary Care Provider: Lucianne Lei, MD Primary Cardiologist: New Primary Electrophysiologist:  None   Patient Profile:   Maureen Bishop is a 68 y.o. female with a hx of HTN, DM, HLD, former smoker and family h/o premature CAD who is being seen today for the evaluation of chest pain at the request of Dr. Reesa Chew, Internal Medicine.   History of Present Illness:   Maureen Bishop is a 68 y.o. female with a hx of HTN, DM, HLD, former smoker and family h/o premature CAD who is being seen today for the evaluation of chest pain at the request of Dr. Reesa Chew, Internal Medicine.   She has no prior cardiac history but family is notable for premature CAD in 1st degree relative. Her father had a MI at the age of 28.   She has known HTN and DM. She is on amlodipine and valsartan-hctz as an outpatient and takes sitagliptin-metformin for DM. She has HLD but not currently on statin therapy. Lipid panel done today shows elevated LDL at 131 mg/dL. Hgb A1c 6.1. BP controlled this am in the 448J systolic. She is a former smoker.   She presented to the [facility] ED last night w/ CC of chest pain. Intermittent left sided CP w/ radiation to arm x 2 days. Associated w/ dyspnea. Had relief in the ED w/ SL NTG. EKG showed NSR w/ nonspecific ST abnormality. Troponin's are negative x 3. Chest CT negative for PE. Mild aortic calcification noted. No mention of coronary artery calcifications. CXR unremarkable.   Past Medical History:  Diagnosis Date   Diabetes mellitus without complication ([place])    Hypertension     Past Surgical History:  Procedure Laterality Date   CESAREAN SECTION       Home Medications:  Prior to Admission medications   Medication Sig Start Date End Date Taking? Authorizing Provider  acetaminophen (TYLENOL) 500 MG tablet Take 1,000 mg by mouth at bedtime.     Yes [provider]  amLODipine (NORVASC) 5 MG tablet Take 5 mg by mouth at bedtime.  06/16/18  Yes [provider]  amoxicillin-clavulanate (AUGMENTIN) 875-125 MG tablet Take 1 tablet by mouth 2 (two) times daily. FOR 10 DAYS 09/02/18  Yes [provider]  naproxen (NAPROSYN) 500 MG tablet Take 500 mg by mouth 2 (two) times daily as needed (for pain).   Yes [provider]  pregabalin (LYRICA) 50 MG capsule Take 50-100 mg by mouth See admin instructions. Take 50 mg by mouth in the morning and 100 mg at bedtime   Yes [provider]  SitaGLIPtin-MetFORMIN HCl (JANUMET XR) [phone] MG TB24 Take 1 tablet by mouth at bedtime.   Yes [provider]  valsartan-hydrochlorothiazide (DIOVAN-HCT) 320-12.5 MG tablet Take 1 tablet by mouth at bedtime.   Yes [provider]  oxyCODONE-acetaminophen (PERCOCET/ROXICET) 5-325 MG per tablet Take 1-2 tablets by mouth every 6 (six) hours as needed for pain. Patient not taking: Reported on 09/05/2018 01/12/13   Davonna Belling, MD    Inpatient Medications: Scheduled Meds:  enoxaparin (LOVENOX) injection  1 mg/kg Subcutaneous Q12H   insulin aspart  0-5 Units Subcutaneous QHS   insulin aspart  0-9 Units Subcutaneous TID WC   Continuous Infusions:  PRN Meds: acetaminophen, nitroGLYCERIN, ondansetron (ZOFRAN) IV  Allergies:    Allergies  Allergen Reactions   Sulfa Antibiotics Hives   Sulfasalazine  Hives    Social History:   Social History   Socioeconomic History   Marital status: Widowed    Spouse name: Not on file   Number of children: Not on file   Years of education: Not on file   Highest education level: Not on file  Occupational History   Not on file  Social Needs   Financial resource strain: Not on file   Food insecurity:    Worry: Not on file    Inability: Not on file   Transportation needs:    Medical: Not on file    Non-medical: Not on file  Tobacco Use    Smoking status: Former Smoker   Smokeless tobacco: Never Used  Substance and Sexual Activity   Alcohol use: Yes    Alcohol/week: 10.0 standard drinks    Types: 10 Glasses of wine per week   Drug use: No   Sexual activity: Yes    Birth control/protection: Post-menopausal  Lifestyle   Physical activity:    Days per week: Not on file    Minutes per session: Not on file   Stress: Not on file  Relationships   Social connections:    Talks on phone: Not on file    Gets together: Not on file    Attends religious service: Not on file    Active member of club or organization: Not on file    Attends meetings of clubs or organizations: Not on file    Relationship status: Not on file   Intimate partner violence:    Fear of current or ex partner: Not on file    Emotionally abused: Not on file    Physically abused: Not on file    Forced sexual activity: Not on file  Other Topics Concern   Not on file  Social History Narrative   Not on file    Family History:    Family History  Problem Relation Age of Onset   CAD Father    Heart attack Father        died at 66 y/o from MI      ROS:  Please see the history of present illness.   All other ROS reviewed and negative.     Physical Exam/Data:   Vitals:   09/05/18 1930 09/05/18 2004 09/06/18 0430 09/06/18 0842  BP: (!) 150/69 (!) 159/69 125/68 132/63  Pulse: 79 76 76 78  Resp: 13 18 18    Temp:  98 F (36.7 C) 98 F (36.7 C) 98.1 F (36.7 C)  TempSrc:  Oral  Oral  SpO2: 98% 100% 100% 100%  Weight:   72.6 kg   Height:       No intake or output data in the 24 hours ending 09/06/18 1047 Last 3 Weights 09/06/2018 09/05/2018 06/07/2012  Weight (lbs) 160 lb 1.6 oz 161 lb 164 lb  Weight (kg) 72.621 kg 73.029 kg 74.39 kg     Body mass index is 31.27 kg/m.  General:  Well nourished, well developed, in no acute distress HEENT: normal Lymph: no adenopathy Neck: no JVD Endocrine:  No thryomegaly Vascular: No carotid  bruits; FA pulses 2+ bilaterally without bruits  Cardiac:  normal S1, S2; RRR; no murmur  Lungs:  clear to auscultation bilaterally, no wheezing, rhonchi or rales  Abd: soft, nontender, no hepatomegaly  Ext: no edema Musculoskeletal:  No deformities, BUE and BLE strength normal and equal Skin: warm and dry  Neuro:  CNs 2-12 intact, no focal abnormalities noted Psych:  Normal affect   EKG:  The EKG was personally reviewed and demonstrates:  NSR w/ nonspecific ST abnormalities.  Telemetry:  Telemetry was personally reviewed and demonstrates:  SR  Relevant CV Studies: None to date   Laboratory Data:  Chemistry Recent Labs  Lab 09/05/18 1532 09/06/18 0045  NA 140  --   K 4.3  --   CL 107  --   CO2 23  --   GLUCOSE 101*  --   BUN 10  --   CREATININE 0.86 0.81  CALCIUM 9.4  --   GFRNONAA >60 >60  GFRAA >60 >60  ANIONGAP 10  --     Recent Labs  Lab 09/05/18 1532  PROT 7.2  ALBUMIN 3.7  AST 31  ALT 33  ALKPHOS 78  BILITOT 0.5   Hematology Recent Labs  Lab 09/05/18 1532 09/06/18 0045  WBC 9.0 9.9  RBC 4.39 4.52  HGB 10.0* 10.0*  HCT 32.2* 32.3*  MCV 73.3* 71.5*  MCH 22.8* 22.1*  MCHC 31.1 31.0  RDW 16.2* 15.9*  PLT 321 320   Cardiac Enzymes Recent Labs  Lab 09/05/18 1532 09/05/18 1823 09/06/18 0045 09/06/18 0236 09/06/18 0659  TROPONINI <0.03 <0.03 <0.03 <0.03 <0.03   No results for input(s): TROPIPOC in the last 168 hours.  BNPNo results for input(s): BNP, PROBNP in the last 168 hours.  DDimer  Recent Labs  Lab 09/05/18 1532  DDIMER 0.70*    Radiology/Studies:  Dg Chest 2 View  Result Date: 09/05/2018 CLINICAL DATA:  68 year old female with chest pain EXAM: CHEST - 2 VIEW COMPARISON:  10/27/2017 FINDINGS: Cardiomediastinal silhouette within normal limits in size and contour. No evidence of central vascular congestion. No pneumothorax or pleural effusion. No confluent airspace disease. No displaced fracture. IMPRESSION: Negative for acute  cardiopulmonary disease Electronically Signed   By: Corrie Mckusick D.O.   On: 09/05/2018 16:04   Ct Angio Chest Pe W And/or Wo Contrast  Result Date: 09/05/2018 CLINICAL DATA:  Shortness of breath EXAM: CT ANGIOGRAPHY CHEST WITH CONTRAST TECHNIQUE: Multidetector CT imaging of the chest was performed using the standard protocol during bolus administration of intravenous contrast. Multiplanar CT image reconstructions and MIPs were obtained to evaluate the vascular anatomy. CONTRAST:  25mL OMNIPAQUE IOHEXOL 350 MG/ML SOLN COMPARISON:  None. FINDINGS: Cardiovascular: --Pulmonary arteries: Contrast injection is sufficient to demonstrate satisfactory opacification of the pulmonary arteries to the segmental level, with attenuation of 315 HU at the main pulmonary artery. There is no pulmonary embolus. The main pulmonary artery is within normal limits for size. --Aorta: Satisfactory opacification of the thoracic aorta. No aortic dissection or other acute aortic syndrome. Conventional 3 vessel aortic branching pattern. The aortic course and caliber are normal. There is mild aortic atherosclerosis. --Heart: Normal size. No pericardial effusion. Mediastinum/Nodes: No mediastinal, hilar or axillary lymphadenopathy. The visualized thyroid and thoracic esophageal course are unremarkable. Lungs/Pleura: No pulmonary nodules or masses. No pleural effusion or pneumothorax. No focal airspace consolidation. No focal pleural abnormality. Upper Abdomen: Contrast bolus timing is not optimized for evaluation of the abdominal organs. Within this limitation, the visualized organs of the upper abdomen are normal. Musculoskeletal: No chest wall abnormality. No acute or significant osseous findings. Review of the MIP images confirms the above findings. IMPRESSION: No pulmonary embolus or other acute thoracic abnormality. Aortic atherosclerosis (ICD10-I70.0). Electronically Signed   By: Ulyses Jarred M.D.   On: 09/05/2018 17:32     Assessment and Plan:   Maureen Bishop is a 68 y.o.  female with a hx of HTN, DM, untreated HLD, former smoker and family h/o premature CAD who is being seen today for the evaluation of chest pain at the request of Dr. Reesa Chew, Internal Medicine.   1. Chest Pain:  The patient has been having progressively worsening dyspnea on exertion and shortness of breath at rest with fatigue and exertional chest pain.  Her risk factors include family history of premature coronary artery disease in her father at age 2, hypertension, hyperlipidemia not treated, previous smoking and diabetes.   I have reviewed her chest CTA done yesterday for pulmonary embolism that shows significant coronary calcification in LAD and RCA.   Given that she has significant risk factors she is considered high risk, also she underwent chest CTA yesterday, I do not 1 the risk another CT with use of iodine contrast in case she needs cardiac catheterization. I will arrange for cardiac catheterization today.  Symptomatolgy is concerning for cardiac etiology and she has multiple cardiac risk factors. Troponins are negative x 3. EKG w/ nonspecific ST abnormalities.   2. HLD: not on statin therapy. Lipid panel today shows LDL at 131 mg/dL. Given cardiac risk factors, including HTN and DM, recommend addition of statin. LDL goal to be based on ischemic w/u. If evidence of CAD, LDL goal will be <70 mg/dL. If no CAD, target LDL < 100 mg/dL.   3. HTN: controlled. Continue on home regimen.   4. DM: management per IM.   For questions or updates, please contact [place] Please consult [email or web address] for contact info under   Signed, Ena Dawley, MD 09/06/2018 10:47 AM   Addendum Coronary CTA showed diffuse moderate CAD including proximal and mid LAD, PDA and first diagonal branch.  We will start aggressive medical management including aspirin 81 mg daily, atorvastatin 80 mg daily, metoprolol 25 mg p.o. twice daily and amlodipine  2.5 mg daily. Anticipated discharge in the morning.  Ena Dawley, MD 09/06/2018

## 2018-09-07 ENCOUNTER — Telehealth: Payer: Self-pay | Admitting: Medical

## 2018-09-07 ENCOUNTER — Telehealth: Payer: Self-pay | Admitting: Cardiology

## 2018-09-07 ENCOUNTER — Encounter (HOSPITAL_COMMUNITY): Payer: Self-pay | Admitting: Cardiovascular Disease

## 2018-09-07 DIAGNOSIS — R0789 Other chest pain: Secondary | ICD-10-CM | POA: Diagnosis not present

## 2018-09-07 DIAGNOSIS — E785 Hyperlipidemia, unspecified: Secondary | ICD-10-CM | POA: Diagnosis not present

## 2018-09-07 DIAGNOSIS — Z794 Long term (current) use of insulin: Secondary | ICD-10-CM | POA: Diagnosis not present

## 2018-09-07 DIAGNOSIS — I25118 Atherosclerotic heart disease of native coronary artery with other forms of angina pectoris: Secondary | ICD-10-CM | POA: Diagnosis not present

## 2018-09-07 DIAGNOSIS — I200 Unstable angina: Secondary | ICD-10-CM | POA: Diagnosis not present

## 2018-09-07 DIAGNOSIS — E119 Type 2 diabetes mellitus without complications: Secondary | ICD-10-CM | POA: Diagnosis not present

## 2018-09-07 DIAGNOSIS — I10 Essential (primary) hypertension: Secondary | ICD-10-CM | POA: Diagnosis not present

## 2018-09-07 DIAGNOSIS — E782 Mixed hyperlipidemia: Secondary | ICD-10-CM | POA: Diagnosis not present

## 2018-09-07 DIAGNOSIS — Z87891 Personal history of nicotine dependence: Secondary | ICD-10-CM | POA: Diagnosis not present

## 2018-09-07 DIAGNOSIS — I2584 Coronary atherosclerosis due to calcified coronary lesion: Secondary | ICD-10-CM | POA: Diagnosis not present

## 2018-09-07 DIAGNOSIS — I25110 Atherosclerotic heart disease of native coronary artery with unstable angina pectoris: Secondary | ICD-10-CM | POA: Diagnosis not present

## 2018-09-07 DIAGNOSIS — Z882 Allergy status to sulfonamides status: Secondary | ICD-10-CM | POA: Diagnosis not present

## 2018-09-07 DIAGNOSIS — Z8249 Family history of ischemic heart disease and other diseases of the circulatory system: Secondary | ICD-10-CM | POA: Diagnosis not present

## 2018-09-07 DIAGNOSIS — I700 Atherosclerosis of aorta: Secondary | ICD-10-CM | POA: Diagnosis not present

## 2018-09-07 LAB — BASIC METABOLIC PANEL
Anion gap: 8 (ref 5–15)
BUN: 7 mg/dL — ABNORMAL LOW (ref 8–23)
CO2: 23 mmol/L (ref 22–32)
Calcium: 9.1 mg/dL (ref 8.9–10.3)
Chloride: 107 mmol/L (ref 98–111)
Creatinine, Ser: 0.82 mg/dL (ref 0.44–1.00)
GFR calc Af Amer: >60 mL/min (ref >60)
GFR calc non Af Amer: >60 mL/min (ref >60)
Glucose, Bld: 114 mg/dL — ABNORMAL HIGH (ref 70–99)
Potassium: 3.9 mmol/L (ref 3.5–5.1)
Sodium: 138 mmol/L (ref 135–145)

## 2018-09-07 LAB — CBC
HCT: 31.8 % — ABNORMAL LOW (ref 36.0–46.0)
Hemoglobin: 9.9 g/dL — ABNORMAL LOW (ref 12.0–15.0)
MCH: 22.2 pg — ABNORMAL LOW (ref 26.0–34.0)
MCHC: 31.1 g/dL (ref 30.0–36.0)
MCV: 71.3 fL — ABNORMAL LOW (ref 80.0–100.0)
Platelets: 318 10*3/uL (ref 150–400)
RBC: 4.46 MIL/uL (ref 3.87–5.11)
RDW: 15.7 % — ABNORMAL HIGH (ref 11.5–15.5)
WBC: 9.1 10*3/uL (ref 4.0–10.5)
nRBC: 0 % (ref 0.0–0.2)

## 2018-09-07 LAB — GLUCOSE, CAPILLARY
Glucose-Capillary: 94 mg/dL (ref 70–99)
Glucose-Capillary: 88 mg/dL (ref 70–99)

## 2018-09-07 LAB — POCT ACTIVATED CLOTTING TIME: Activated Clotting Time: 169 seconds

## 2018-09-07 MED ORDER — CLOPIDOGREL BISULFATE 75 MG PO TABS: 75.0000 mg | ORAL_TABLET | Freq: Every day | ORAL | 0 refills | Status: DC

## 2018-09-07 MED ORDER — NITROGLYCERIN 0.4 MG SL SUBL: 0.4000 mg | SUBLINGUAL_TABLET | SUBLINGUAL | 0 refills | Status: AC | PRN

## 2018-09-07 MED ORDER — ASPIRIN EC 81 MG PO TBEC: 81.0000 mg | DELAYED_RELEASE_TABLET | Freq: Every day | ORAL | 0 refills | Status: AC

## 2018-09-07 MED ORDER — METOPROLOL TARTRATE 25 MG PO TABS: 25.0000 mg | ORAL_TABLET | Freq: Two times a day (BID) | ORAL | 0 refills | Status: DC

## 2018-09-07 MED ORDER — ATORVASTATIN CALCIUM 80 MG PO TABS: 80.0000 mg | ORAL_TABLET | Freq: Every day | ORAL | 0 refills | Status: DC

## 2018-09-07 MED ORDER — AMLODIPINE BESYLATE 2.5 MG PO TABS: 2.5000 mg | ORAL_TABLET | Freq: Every day | ORAL | 0 refills | Status: DC

## 2018-09-07 MED ORDER — AMLODIPINE BESYLATE 2.5 MG PO TABS: 2.5000 mg | ORAL_TABLET | Freq: Every day | ORAL | Status: DC

## 2018-09-07 MED FILL — AMLODIPINE BESYLATE 2.5 MG: 2.5 | 30 days supply | Qty: 30 | Fill #0

## 2018-09-07 MED FILL — NITROGLYCERIN 0.4 MG TAB SL: 0.4 | 8 days supply | Qty: 25 | Fill #0

## 2018-09-07 MED FILL — CLOPIDOGREL 75 MG TABLET: 75 | 30 days supply | Qty: 30 | Fill #0

## 2018-09-07 MED FILL — METOPROLOL TARTRATE 25 MG T: 25 | 30 days supply | Qty: 60 | Fill #0

## 2018-09-07 MED FILL — ATORVASTATIN CALCIUM 80 MG: 80 | 30 days supply | Qty: 30 | Fill #0

## 2018-09-07 MED FILL — ASPIRIN LOW DOSE 81 MG TBEC: 81 | 30 days supply | Qty: 30 | Fill #0

## 2018-09-07 NOTE — Discharge Instructions (Signed)
PLEASE REMEMBER TO BRING ALL OF YOUR MEDICATIONS TO EACH OF YOUR FOLLOW-UP OFFICE VISITS.  PLEASE ATTEND ALL SCHEDULED FOLLOW-UP APPOINTMENTS.   Activity: Increase activity slowly as tolerated. You may shower, but no soaking baths (or swimming) for 1 week. No driving for 24 hours. No lifting over 5 lbs for 1 week. No sexual activity for 1 week.   You May Return to Work: in 1 week (if applicable)  Wound Care: You may wash cath site gently with soap and water. Keep cath site clean and dry. If you notice pain, swelling, bleeding or pus at your cath site, please call [phone].  _____________________________________________________________________  Obtain a blood pressure cuff prior to your follow-up visit in order to monitor your blood pressure and heart rate at home. Check your blood pressure in the morning and evening and keep a log to refer to at your follow-up visit with cardiology.  _____________________________________________________________________  YOUR CARDIOLOGY TEAM HAS ARRANGED FOR AN E-VISIT FOR YOUR APPOINTMENT - PLEASE REVIEW IMPORTANT INFORMATION BELOW SEVERAL DAYS PRIOR TO YOUR APPOINTMENT  Due to the recent COVID-19 pandemic, we are transitioning in-person office visits to tele-medicine visits in an effort to decrease unnecessary exposure to our patients, their families, and staff. Medicare and most insurances are covering these visits without a copay needed. We also encourage you to sign up for MyChart if you have not already done so. You will need a smartphone if possible. For patients that do not have this, we can still complete the visit using a regular telephone but do prefer a smartphone to enable video when possible. You may have a family member that lives with you that can help. If possible, we also ask that you have a blood pressure cuff and scale at home to measure your blood pressure, heart rate and weight prior to your scheduled appointment. Patients with clinical  needs that need an in-person evaluation and testing will still be able to come to the office if absolutely necessary. If you have any questions, feel free to call our office.     YOUR PROVIDER WILL BE USING ONE OF THE FOLLOWING PLATFORM TO COMPLETE YOUR VISIT:    IF USING WEBEX - How to Download the WebEx App to Your SmartPhone  - If Apple device, go to [employer] and type in WebEx in the search bar. [place] [employer], the blue/green circle. If Android, go to [employer] and type in [employer] in the search bar. The app is free but as with any other app download, your phone may require you to verify saved payment information or Apple/Android password.  - You do NOT have to create an account. - On the day of the visit, our staff will walk you through joining the meeting with the meeting number/password.   IF USING MYCHART - How to Download the MyChart App to Your SmartPhone   - If Apple, go to [employer] and type in MyChart in the search bar and download the app. If Android, ask patient to go to [employer] and type in [place] in the search bar and download the app. The app is free but as with any other app downloads, your phone may require you to verify saved payment information or Apple/Android password.  - You will need to then log into the app with your MyChart username and password, and select  as your healthcare provider to link the account. When it is time for your visit, go to the [employer],  find appointments, and click Begin Video Visit. Be sure to Select Allow for your device to access the Microphone and Camera for your visit. You will then be connected, and your provider will be with you shortly.  **If you have any issues connecting or need assistance, please contact MyChart service desk [phone])**  **If using a computer, in order to ensure the best quality for your visit, you will need to use either of the following Internet Browsers:  [employer], or Google Chrome**   IF USING DOXIMITY or [email or web address].ME - The staff will give you instructions on receiving your link to join the meeting the day of your visit.      2-3 DAYS BEFORE YOUR APPOINTMENT  You will receive a telephone call from one of our Friend team members - your caller ID may say "Unknown caller." If this is a video visit, we will walk you through how to set up your device to be able to complete the visit. We will remind you check your blood pressure, heart rate and weight prior to your scheduled appointment. If you have an Apple Watch or Kardia, please upload any pertinent ECG strips the day before or morning of your appointment to [place]. Our staff will also make sure you have reviewed the consent and agree to move forward with your scheduled tele-health visit.     THE DAY OF YOUR APPOINTMENT  Approximately 15 minutes prior to your scheduled appointment, you will receive a telephone call from one of [place] team - your caller ID may say "Unknown caller."  Our staff will confirm medications, vital signs for the day and any symptoms you may be experiencing. Please have this information available prior to the time of visit start. It may also be helpful for you to have a pad of paper and pen handy for any instructions given during your visit. They will also walk you through joining the smartphone meeting if this is a video visit.    CONSENT FOR TELE-HEALTH VISIT - PLEASE REVIEW  I hereby voluntarily request, consent and authorize [facility] and its employed or contracted physicians, physician assistants, nurse practitioners or other licensed health care professionals (the Practitioner), to provide me with telemedicine health care services (the Services") as deemed necessary by the treating Practitioner. I acknowledge and consent to receive the Services by the Practitioner via telemedicine. I understand that the telemedicine visit will involve communicating with  the Practitioner through live audiovisual communication technology and the disclosure of certain medical information by electronic transmission. I acknowledge that I have been given the opportunity to request an in-person assessment or other available alternative prior to the telemedicine visit and am voluntarily participating in the telemedicine visit.  I understand that I have the right to withhold or withdraw my consent to the use of telemedicine in the course of my care at any time, without affecting my right to future care or treatment, and that the Practitioner or I may terminate the telemedicine visit at any time. I understand that I have the right to inspect all information obtained and/or recorded in the course of the telemedicine visit and may receive copies of available information for a reasonable fee.  I understand that some of the potential risks of receiving the Services via telemedicine include:   Delay or interruption in medical evaluation due to technological equipment failure or disruption;  Information transmitted may not be sufficient (e.g. poor resolution of images) to allow for appropriate medical decision making by the Practitioner;  and/or   In rare instances, security protocols could fail, causing a breach of personal health information.  Furthermore, I acknowledge that it is my responsibility to provide information about my medical history, conditions and care that is complete and accurate to the best of my ability. I acknowledge that Practitioner's advice, recommendations, and/or decision may be based on factors not within their control, such as incomplete or inaccurate data provided by me or distortions of diagnostic images or specimens that may result from electronic transmissions. I understand that the practice of medicine is not an exact science and that Practitioner makes no warranties or guarantees regarding treatment outcomes. I acknowledge that I will receive a copy of  this consent concurrently upon execution via email to the email address I last provided but may also request a printed copy by calling the office of [facility].    I understand that my insurance will be billed for this visit.   I have read or had this consent read to me.  I understand the contents of this consent, which adequately explains the benefits and risks of the Services being provided via telemedicine.   I have been provided ample opportunity to ask questions regarding this consent and the Services and have had my questions answered to my satisfaction.  I give my informed consent for the services to be provided through the use of telemedicine in my medical care  By participating in this telemedicine visit I agree to the above.

## 2018-09-07 NOTE — Progress Notes (Signed)
   Patient underwent cardiac catheterization 09/06/2018 and was found to have moderate CAD, recommended for medical management. She was started on metoprolol and amlodipine for antianginal effects, as well as plavix, aspirin, and statin. BP /HR/ Hgb/ Cr stable today and no complications post-catheterization. Patient denies recurrent CP and reports feeling better today.   [facility] will sign off.   Medication Recommendations:  Continue amlodipine 5mg  daily, metoprolol 25mg  BID, home diovan, aspirin 81mg  daily, plavix 75mg  daily, and atorvastatin 40mg  daily. Please send a prescription for prn nitroglycerine  Other recommendations (labs, testing, etc):  Recommend that patient obtain a blood pressure cuff for home monitoring of HR and BP.  Follow up as an outpatient:  E-visit follow-up with Melina Copa 09/13/2018 at [place] - Discharge instructions updated. Email sent to patient to sign up for MyChart.

## 2018-09-07 NOTE — Discharge Summary (Signed)
Physician Discharge Summary  Maureen Bishop FYB:[id number] DOB: 02-Jan-1951 DOA: 09/05/2018  PCP: Lucianne Lei, MD  Admit date: 09/05/2018 Discharge date: 09/07/2018  Admitted From: Home Disposition:  Home  Recommendations for Outpatient Follow-up:  1. Follow up with PCP in 1-2 weeks 2. Please obtain BMP/CBC in one week your next doctors visit.  3. Cardiology recommendations for medications-reduce amlodipine to 2.5 mg.  Metoprolol 25 mg twice daily, aspirin 81 mg daily, Plavix 75 mg daily, atorvastatin daily.  Home Health: None Equipment/Devices: None Discharge Condition: Stable CODE STATUS: Full code Diet recommendation: Cardiac  Brief/Interim Summary: 68 year old with history of essential hypertension, diabetes mellitus type 2, hyperlipidemia, history of premature CAD in the family, tobacco use came to the hospital complains of chest pain. Due to abnormal-looking coronaries on the CTA of the chest and her symptomatology she was taken for left heart cardiac catheterization on 4/16 which showed moderate nonobstructive coronary artery disease and medical management was recommended.  Norvasc dose was reduced to 2.5 mg daily and metoprolol 25 mg twice daily was added.  Due to elevated LDL she was started on statin.  Rest of the medication as described above. Stable for discharge today.  She is chest pain-free.  Discharge Diagnoses:  Principal Problem:   Chest pain Active Problems:   Benign essential HTN   Type 2 diabetes mellitus without complication (HCC)   Hyperlipidemia  Atypical chest pain, resolved Nonobstructive coronary artery disease -Left heart catheterization 4/16.  Recommended medical management.  Per cardiology patient was started on aspirin statin and beta-blocker.  Amlodipine was reduced.  LDL elevated at 131 therefore atorvastatin started. -CT of the chest is negative for pulmonary embolism.  Currently chest pain-free and feeling much better.  Essential hypertension -  Reduce amlodipine.  Continue Diovan.  Metoprolol added.  Hyperlipidemia -Elevated LDL.  Advised diet control and atorvastatin started.  Goal LDL less than 70  Diabetes mellitus type 2 - Advised to hold off on metformin for another 24 hours and then resume her diabetic medications at home starting tomorrow.  Consultations:  Cardiology  Subjective: Feels much better.  Wishes to go home.  Discharge Exam: Vitals:   09/07/18 0827 09/07/18 1006  BP: 122/65 (!) 116/58  Pulse: 78 66  Resp: 18   Temp: 97.8 F (36.6 C)   SpO2: 99%    Vitals:   09/07/18 0650 09/07/18 0709 09/07/18 0827 09/07/18 1006  BP: 124/63 117/69 122/65 (!) 116/58  Pulse:  72 78 66  Resp: 13 12 18    Temp: 97.6 F (36.4 C)  97.8 F (36.6 C)   TempSrc: Oral  Oral   SpO2: 96% 99% 99%   Weight:      Height:        General: Pt is alert, awake, not in acute distress Cardiovascular: RRR, S1/S2 +, no rubs, no gallops Respiratory: CTA bilaterally, no wheezing, no rhonchi Abdominal: Soft, NT, ND, bowel sounds + Extremities: no edema, no cyanosis  Discharge Instructions  Discharge Instructions    AMB Referral to Cardiac Rehabilitation - Phase II   Complete by:  As directed    Diagnosis:  Stable Angina     Allergies as of 09/07/2018      Reactions   Sulfa Antibiotics Hives   Sulfasalazine Hives      Medication List    TAKE these medications   acetaminophen 500 MG tablet Commonly known as:  TYLENOL Take 1,000 mg by mouth at bedtime.   amLODipine 2.5 MG tablet Commonly known as:  NORVASC Take 1 tablet (2.5 mg total) by mouth at bedtime for 30 days. What changed:    medication strength  how much to take   amoxicillin-clavulanate 875-125 MG tablet Commonly known as:  AUGMENTIN Take 1 tablet by mouth 2 (two) times daily. FOR 10 DAYS   aspirin EC 81 MG tablet Take 1 tablet (81 mg total) by mouth daily for 30 days.   atorvastatin 80 MG tablet Commonly known as:  LIPITOR Take 1 tablet (80 mg  total) by mouth daily at 6 (six) AM for 30 days. Start taking on:  September 08, 2018   clopidogrel 75 MG tablet Commonly known as:  PLAVIX Take 1 tablet (75 mg total) by mouth daily with breakfast for 30 days. Start taking on:  September 08, 2018   Janumet XR [phone] MG Tb24 Generic drug:  SitaGLIPtin-MetFORMIN HCl Take 1 tablet by mouth at bedtime.   metoprolol tartrate 25 MG tablet Commonly known as:  LOPRESSOR Take 1 tablet (25 mg total) by mouth 2 (two) times daily for 30 days.   naproxen 500 MG tablet Commonly known as:  NAPROSYN Take 500 mg by mouth 2 (two) times daily as needed (for pain).   nitroGLYCERIN 0.4 MG SL tablet Commonly known as:  NITROSTAT Place 1 tablet (0.4 mg total) under the tongue every 5 (five) minutes as needed for chest pain.   oxyCODONE-acetaminophen 5-325 MG tablet Commonly known as:  PERCOCET/ROXICET Take 1-2 tablets by mouth every 6 (six) hours as needed for pain.   pregabalin 50 MG capsule Commonly known as:  LYRICA Take 50-100 mg by mouth See admin instructions. Take 50 mg by mouth in the morning and 100 mg at bedtime   valsartan-hydrochlorothiazide 320-12.5 MG tablet Commonly known as:  DIOVAN-HCT Take 1 tablet by mouth at bedtime.      Follow-up Information    Charlie Pitter, PA-C Follow up on 09/13/2018.   Specialties:  Cardiology, Radiology Why:  E-visit has been scheduled for 09/13/2018 at 9:30am. Someone will contact you 2-3 days prior to your appointment with instructions. Contact information: [address] [place] [address] [phone]        Lucianne Lei, MD. Schedule an appointment as soon as possible for a visit in 2 week(s).   Specialty:  Family Medicine Contact information: [address] [place] [address] [phone]          Allergies  Allergen Reactions  . Sulfa Antibiotics Hives  . Sulfasalazine Hives    You were cared for by a hospitalist during your hospital stay. If you  have any questions about your discharge medications or the care you received while you were in the hospital after you are discharged, you can call the unit and asked to speak with the hospitalist on call if the hospitalist that took care of you is not available. Once you are discharged, your primary care physician will handle any further medical issues. Please note that no refills for any discharge medications will be authorized once you are discharged, as it is imperative that you return to your primary care physician (or establish a relationship with a primary care physician if you do not have one) for your aftercare needs so that they can reassess your need for medications and monitor your lab values.   Procedures/Studies: Dg Chest 2 View  Result Date: 09/05/2018 CLINICAL DATA:  68 year old female with chest pain EXAM: CHEST - 2 VIEW COMPARISON:  10/27/2017 FINDINGS: Cardiomediastinal silhouette within  normal limits in size and contour. No evidence of central vascular congestion. No pneumothorax or pleural effusion. No confluent airspace disease. No displaced fracture. IMPRESSION: Negative for acute cardiopulmonary disease Electronically Signed   By: Corrie Mckusick D.O.   On: 09/05/2018 16:04   Ct Angio Chest Pe W And/or Wo Contrast  Result Date: 09/05/2018 CLINICAL DATA:  Shortness of breath EXAM: CT ANGIOGRAPHY CHEST WITH CONTRAST TECHNIQUE: Multidetector CT imaging of the chest was performed using the standard protocol during bolus administration of intravenous contrast. Multiplanar CT image reconstructions and MIPs were obtained to evaluate the vascular anatomy. CONTRAST:  72mL OMNIPAQUE IOHEXOL 350 MG/ML SOLN COMPARISON:  None. FINDINGS: Cardiovascular: --Pulmonary arteries: Contrast injection is sufficient to demonstrate satisfactory opacification of the pulmonary arteries to the segmental level, with attenuation of 315 HU at the main pulmonary artery. There is no pulmonary embolus. The main  pulmonary artery is within normal limits for size. --Aorta: Satisfactory opacification of the thoracic aorta. No aortic dissection or other acute aortic syndrome. Conventional 3 vessel aortic branching pattern. The aortic course and caliber are normal. There is mild aortic atherosclerosis. --Heart: Normal size. No pericardial effusion. Mediastinum/Nodes: No mediastinal, hilar or axillary lymphadenopathy. The visualized thyroid and thoracic esophageal course are unremarkable. Lungs/Pleura: No pulmonary nodules or masses. No pleural effusion or pneumothorax. No focal airspace consolidation. No focal pleural abnormality. Upper Abdomen: Contrast bolus timing is not optimized for evaluation of the abdominal organs. Within this limitation, the visualized organs of the upper abdomen are normal. Musculoskeletal: No chest wall abnormality. No acute or significant osseous findings. Review of the MIP images confirms the above findings. IMPRESSION: No pulmonary embolus or other acute thoracic abnormality. Aortic atherosclerosis (ICD10-I70.0). Electronically Signed   By: Ulyses Jarred M.D.   On: 09/05/2018 17:32     The results of significant diagnostics from this hospitalization (including imaging, microbiology, ancillary and laboratory) are listed below for reference.     Microbiology: No results found for this or any previous visit (from the past 240 hour(s)).   Labs: BNP (last 3 results) No results for input(s): BNP in the last 8760 hours. Basic Metabolic Panel: Recent Labs  Lab 09/05/18 1532 09/06/18 0045 09/06/18 1213 09/07/18 0223  NA 140  --  139 138  K 4.3  --  3.7 3.9  CL 107  --  108 107  CO2 23  --  21* 23  GLUCOSE 101*  --  100* 114*  BUN 10  --  8 7*  CREATININE 0.86 0.81 0.79 0.82  CALCIUM 9.4  --  9.2 9.1   Liver Function Tests: Recent Labs  Lab 09/05/18 1532  AST 31  ALT 33  ALKPHOS 78  BILITOT 0.5  PROT 7.2  ALBUMIN 3.7   Recent Labs  Lab 09/05/18 1532  LIPASE 27    No results for input(s): AMMONIA in the last 168 hours. CBC: Recent Labs  Lab 09/05/18 1532 09/06/18 0045 09/07/18 0223  WBC 9.0 9.9 9.1  NEUTROABS 6.1  --   --   HGB 10.0* 10.0* 9.9*  HCT 32.2* 32.3* 31.8*  MCV 73.3* 71.5* 71.3*  PLT 321 320 318   Cardiac Enzymes: Recent Labs  Lab 09/05/18 1532 09/05/18 1823 09/06/18 0045 09/06/18 0236 09/06/18 0659  TROPONINI <0.03 <0.03 <0.03 <0.03 <0.03   BNP: Invalid input(s): POCBNP CBG: Recent Labs  Lab 09/06/18 0840 09/06/18 1156 09/06/18 1603 09/06/18 2111 09/07/18 0632  GLUCAP 103* 105* 91 106* 94   D-Dimer Recent Labs  09/05/18 1532  DDIMER 0.70*   Hgb A1c Recent Labs    09/06/18 0045  HGBA1C 6.1*   Lipid Profile Recent Labs    09/06/18 0236  CHOL 194  HDL 46  LDLCALC 131*  TRIG 86  CHOLHDL 4.2   Thyroid function studies No results for input(s): TSH, T4TOTAL, T3FREE, THYROIDAB in the last 72 hours.  Invalid input(s): FREET3 Anemia work up No results for input(s): VITAMINB12, FOLATE, FERRITIN, TIBC, IRON, RETICCTPCT in the last 72 hours. Urinalysis    Component Value Date/Time   COLORURINE YELLOW 08/22/2011 1017   APPEARANCEUR CLOUDY (A) 08/22/2011 1017   LABSPEC 1.020 08/22/2011 1017   PHURINE 5.0 08/22/2011 1017   GLUCOSEU NEGATIVE 08/22/2011 1017   HGBUR TRACE (A) 08/22/2011 1017   BILIRUBINUR NEGATIVE 08/22/2011 1017   KETONESUR NEGATIVE 08/22/2011 1017   PROTEINUR NEGATIVE 08/22/2011 1017   UROBILINOGEN 0.2 08/22/2011 1017   NITRITE NEGATIVE 08/22/2011 1017   LEUKOCYTESUR MODERATE (A) 08/22/2011 1017   Sepsis Labs Invalid input(s): PROCALCITONIN,  WBC,  LACTICIDVEN Microbiology No results found for this or any previous visit (from the past 240 hour(s)).   Time coordinating discharge:  I have spent 35 minutes face to face with the patient and on the ward discussing the patients care, assessment, plan and disposition with other care givers. >50% of the time was devoted  counseling the patient about the risks and benefits of treatment/Discharge disposition and coordinating care.   SIGNED:   Damita Lack, MD  [facility] 09/07/2018, 10:48 AM   If 7PM-7AM, please contact night-coverage [email or web address]

## 2018-09-07 NOTE — Progress Notes (Signed)
Cardiac Rehab Advisory Cardiac Rehab Phase I is not seeing pts face to face at this time due to Covid 19 restrictions. Ambulation is occurring through nursing, PT, and mobility teams. We will help facilitate that process as needed. We are calling pts in their rooms and discussing education. We will then deliver education materials to pts RN for delivery to pt.  [phone] Called pt and reviewed NTG use, discussed healthy food choices with watching carbs and fats. Gave heart healthy and diabetic diets. Discussed ex ed which is walking and CRP 2. Order written by Dr Gwenlyn Found for CRP 2 [place]. Reviewed NTG use. Diets , ex guidelines, CRP 2 brochure  given to staff to give to pt.  Pt voiced understanding of ed by phone. Graylon Good RN BSN 09/07/2018 8:04 AM

## 2018-09-07 NOTE — Telephone Encounter (Signed)
New Message    Pt has TOC appt scheduled with Melina Copa 09/13/18 at 9am

## 2018-09-07 NOTE — Plan of Care (Signed)

## 2018-09-07 NOTE — Telephone Encounter (Signed)
   TELEPHONE CALL NOTE  This patient has been deemed a candidate for follow-up tele-health visit to limit community exposure during the Covid-19 pandemic. I spoke with the patient via phone to discuss instructions. This has been outlined on the patient's AVS. The patient was advised to review the section on consent for treatment as well. The patient will receive a phone call 2-3 days prior to their E-Visit at which time consent will be verbally confirmed. A Virtual Office Visit appointment type has been scheduled for 4/23/220 at 9:30am with Melina Copa.  I have either confirmed the patient is active in MyChart or offered to send sign-up link to phone/email via Mychart icon beside patient's photo.  Abigail Butts, PA-C 09/07/2018 9:30 AM

## 2018-09-07 NOTE — Progress Notes (Signed)
Provided verbal discharge instructions patient. Provided paper copy of d/c instructions. Gave cardiac rehab packet left by cardiac rehab. No further questions. Patient drove self to hospital and advise will drive self home. Paged provider @[phone]  to advise of patients plans. Paged returned@ [phone] by PA, Sarajane Jews advise against patient driving self home. RN advise patient against driving home per PA. Patient contacted sister to pick her up. IV removed per orders. Patient VSS at d/c Belongings sent with patient. RN transported patient via wheelchair where sister was waiting in private vehicle through the ED entrance.

## 2018-09-07 NOTE — Progress Notes (Signed)
Progress Note  Patient Name: Maureen Bishop Date of Encounter: 09/07/2018  Primary Cardiologist: No primary care provider on file.   Subjective   No more chest pain.  Inpatient Medications    Scheduled Meds: . amLODipine  5 mg Oral QHS  . atorvastatin  40 mg Oral q1800  . atorvastatin  80 mg Oral Q0600  . clopidogrel  75 mg Oral Q breakfast  . enoxaparin (LOVENOX) injection  40 mg Subcutaneous Q24H  . irbesartan  300 mg Oral Daily   And  . hydrochlorothiazide  12.5 mg Oral Daily  . insulin aspart  0-5 Units Subcutaneous QHS  . insulin aspart  0-9 Units Subcutaneous TID WC  . metoprolol tartrate  25 mg Oral BID  . pregabalin  50 mg Oral Daily   And  . pregabalin  100 mg Oral QHS  . sodium chloride flush  3 mL Intravenous Q12H   Continuous Infusions: . sodium chloride     PRN Meds: sodium chloride, acetaminophen, morphine injection, nitroGLYCERIN, ondansetron (ZOFRAN) IV, sodium chloride flush   Vital Signs    Vitals:   09/07/18 0104 09/07/18 0650 09/07/18 0709 09/07/18 0827  BP:  124/63 117/69 122/65  Pulse:   72 78  Resp:  13 12 18   Temp:  97.6 F (36.4 C)  97.8 F (36.6 C)  TempSrc:  Oral  Oral  SpO2: 99% 96% 99% 99%  Weight:      Height:        Intake/Output Summary (Last 24 hours) at 09/07/2018 0919 Last data filed at 09/07/2018 0828 Gross per 24 hour  Intake 240 ml  Output 0 ml  Net 240 ml   Last 3 Weights 09/06/2018 09/05/2018 06/07/2012  Weight (lbs) 160 lb 1.6 oz 161 lb 164 lb  Weight (kg) 72.621 kg 73.029 kg 74.39 kg      Telemetry    SR - Personally Reviewed  ECG    SR - Personally Reviewed  Physical Exam   GEN: No acute distress.   Neck: No JVD Cardiac: RRR, no murmurs, rubs, or gallops.  Respiratory: Clear to auscultation bilaterally. GI: Soft, nontender, non-distended  MS: No edema; No deformity. Neuro:  Nonfocal  Psych: Normal affect  Groin access site with no bruit  Labs    Chemistry Recent Labs  Lab 09/05/18 1532  09/06/18 0045 09/06/18 1213 09/07/18 0223  NA 140  --  139 138  K 4.3  --  3.7 3.9  CL 107  --  108 107  CO2 23  --  21* 23  GLUCOSE 101*  --  100* 114*  BUN 10  --  8 7*  CREATININE 0.86 0.81 0.79 0.82  CALCIUM 9.4  --  9.2 9.1  PROT 7.2  --   --   --   ALBUMIN 3.7  --   --   --   AST 31  --   --   --   ALT 33  --   --   --   ALKPHOS 78  --   --   --   BILITOT 0.5  --   --   --   GFRNONAA >60 >60 >60 >60  GFRAA >60 >60 >60 >60  ANIONGAP 10  --  10 8     Hematology Recent Labs  Lab 09/05/18 1532 09/06/18 0045 09/07/18 0223  WBC 9.0 9.9 9.1  RBC 4.39 4.52 4.46  HGB 10.0* 10.0* 9.9*  HCT 32.2* 32.3* 31.8*  MCV 73.3* 71.5*  71.3*  MCH 22.8* 22.1* 22.2*  MCHC 31.1 31.0 31.1  RDW 16.2* 15.9* 15.7*  PLT 321 320 318    Cardiac Enzymes Recent Labs  Lab 09/05/18 1823 09/06/18 0045 09/06/18 0236 09/06/18 0659  TROPONINI <0.03 <0.03 <0.03 <0.03   No results for input(s): TROPIPOC in the last 168 hours.   BNPNo results for input(s): BNP, PROBNP in the last 168 hours.   DDimer  Recent Labs  Lab 09/05/18 1532  DDIMER 0.70*     Radiology    Dg Chest 2 View  Result Date: 09/05/2018 CLINICAL DATA:  68 year old female with chest pain EXAM: CHEST - 2 VIEW COMPARISON:  10/27/2017 FINDINGS: Cardiomediastinal silhouette within normal limits in size and contour. No evidence of central vascular congestion. No pneumothorax or pleural effusion. No confluent airspace disease. No displaced fracture. IMPRESSION: Negative for acute cardiopulmonary disease Electronically Signed   By: Corrie Mckusick D.O.   On: 09/05/2018 16:04   Ct Angio Chest Pe W And/or Wo Contrast  Result Date: 09/05/2018 CLINICAL DATA:  Shortness of breath EXAM: CT ANGIOGRAPHY CHEST WITH CONTRAST TECHNIQUE: Multidetector CT imaging of the chest was performed using the standard protocol during bolus administration of intravenous contrast. Multiplanar CT image reconstructions and MIPs were obtained to evaluate the  vascular anatomy. CONTRAST:  29mL OMNIPAQUE IOHEXOL 350 MG/ML SOLN COMPARISON:  None. FINDINGS: Cardiovascular: --Pulmonary arteries: Contrast injection is sufficient to demonstrate satisfactory opacification of the pulmonary arteries to the segmental level, with attenuation of 315 HU at the main pulmonary artery. There is no pulmonary embolus. The main pulmonary artery is within normal limits for size. --Aorta: Satisfactory opacification of the thoracic aorta. No aortic dissection or other acute aortic syndrome. Conventional 3 vessel aortic branching pattern. The aortic course and caliber are normal. There is mild aortic atherosclerosis. --Heart: Normal size. No pericardial effusion. Mediastinum/Nodes: No mediastinal, hilar or axillary lymphadenopathy. The visualized thyroid and thoracic esophageal course are unremarkable. Lungs/Pleura: No pulmonary nodules or masses. No pleural effusion or pneumothorax. No focal airspace consolidation. No focal pleural abnormality. Upper Abdomen: Contrast bolus timing is not optimized for evaluation of the abdominal organs. Within this limitation, the visualized organs of the upper abdomen are normal. Musculoskeletal: No chest wall abnormality. No acute or significant osseous findings. Review of the MIP images confirms the above findings. IMPRESSION: No pulmonary embolus or other acute thoracic abnormality. Aortic atherosclerosis (ICD10-I70.0). Electronically Signed   By: Ulyses Jarred M.D.   On: 09/05/2018 17:32   Cardiac Studies   TTE: 09/06/2018   Prox LAD lesion is 70% stenosed.  Ost 1st Diag lesion is 70% stenosed.  Mid LAD lesion is 75% stenosed.  Ost 1st Mrg to 1st Mrg lesion is 75% stenosed.  The left ventricular systolic function is normal.  LV end diastolic pressure is normal.  The left ventricular ejection fraction is 55-65% by visual estimate.    Patient Profile     68 y.o. female   [place]     1. Moderate non-obstructive CAD as  above 2. HLD: not on statin therapy. Lipid panel today shows LDL at 131 mg/dL. Given cardiac risk factors, including HTN and DM, recommend addition of statin. LDL goal to be based on ischemic w/u. If evidence of CAD, LDL goal will be <70 mg/dL. If no CAD, target LDL < 100 mg/dL.  3. HTN: added amlodipine.  4. DM: management per IM.   [facility] will sign off.   Medication Recommendations:  Decrease amlodipine to 2.5 mg po  QHS Other recommendations (labs, testing, etc):  No further testing Follow up as an outpatient:  We will arrange a virtual visit in 2-3 weeks with BP check. Will need labs in 3 weeks including lipids, CMP, CBC.  She can be discharged today.  For questions or updates, please contact [place] Please consult [email or web address] for contact info under     Signed, Ena Dawley, MD  09/07/2018, 9:19 AM

## 2018-09-10 MED ORDER — VALSARTAN-HYDROCHLOROTHIAZIDE 320-12.5 MG PO TABS: 1.0000 | ORAL_TABLET | Freq: Every day | ORAL | 0 refills | Status: DC

## 2018-09-10 NOTE — Telephone Encounter (Addendum)
Patient contacted regarding discharge from [facility] on Friday September 07, 2018.  Patient understands to follow up with provider Melina Copa, PA on Thursday September 13, 2018 at 9 AM-Virtual Visit. Patient understands discharge instructions? Yes Patient understands medications and regiment? Yes Patient understands to bring all medications to this visit? Yes-Virtual Visit-knows to have medications,BP,pulse, weight if possible. Virtual Visit 09/13/18-pt activated MyChart while I was on the phone with her -aware she will get a call from our office with details about Virtual Visit in next 1-2 days.  Pt states that she is not allergic to aspirin and has been taking aspirin 81 mg daily since discharge from the hospital.

## 2018-09-10 NOTE — Telephone Encounter (Signed)
TOC call attempt #1.... [id number]

## 2018-09-11 ENCOUNTER — Telehealth: Payer: Self-pay | Admitting: Physician Assistant

## 2018-09-11 ENCOUNTER — Encounter: Payer: Self-pay | Admitting: Physician Assistant

## 2018-09-11 NOTE — Telephone Encounter (Signed)
Called patient to speak with her to see was capable of a video visit or telephone = left message for patient to call me back

## 2018-09-11 NOTE — Progress Notes (Addendum)
Virtual Visit via Video Note   This visit type was conducted due to national recommendations for restrictions regarding the COVID-19 Pandemic (e.g. social distancing) in an effort to limit this patient's exposure and mitigate transmission in our community.  Due to her co-morbid illnesses, this patient is at least at moderate risk for complications without adequate follow up.  This format is felt to be most appropriate for this patient at this time.  All issues noted in this document were discussed and addressed.  A limited physical exam was performed with this format.  Please refer to the patient's chart for her consent to telehealth for [facility].   Evaluation Performed:  Follow-up visit  Date:  09/13/2018   ID:  Maureen Bishop, DOB [birth year], MRN [id number]  Patient Location: Home Provider Location: Home  PCP:  Lucianne Lei, MD  Cardiologist:  Ena Dawley, MD  Electrophysiologist:  None   Chief Complaint: f/u cath  History of Present Illness:    Maureen Bishop is a 68 y.o. female with HTN, DM, HLD, former smoker, and recently diagnosed moderate CAD and microcytic anemia who presents for post-hospital follow-up via telehealth visit. She was recently admitted with intermittent chest pain and ruled out for MI. Chest CT was negative for PE with mild aortic calcification noted. CXR was unremarkable. She underwent cardiac cath showing moderate CAD with 70% prox LAD, 75% mLAD (not physiologically significant by FFR), 70% D1, 75% OM1 (relatively small vessels) - medical therapy recommended. It ws recommended to reserve PCI for recalcitrant symptoms. She was started on Plavix given ASA allergy although it was on her DC list. She was also started on atorvastatin and metoprolol. Labs showed A1C 6.1, LDL 131, Hgb 9.9 (microcytic - previously 12 range in 2014), K 3.9, Cr 0.82, LFTs wnl.  On our telehealth visit today, she has no complaints of chest pain, shortness of breath, palpitations,  near-syncope, syncope, or bleeding. Overall she is feeling well. She has been taking BOTH ASA and plavix.  She does not have an ASA allergy. Given her microcytic anemia and nonobstructive CAD, she only needs to take ASA. I will discontinue her plavix.  Her groin cath site is still tender, but is getting better. No obvious bruising or signs of hematoma. Site is described as clean, dry, and intact. The tenderness is getting better, not worse. We discussed when to call our office if her groin is not healing well.    The patient does not have symptoms concerning for COVID-19 infection (fever, chills, cough, or new shortness of breath).    Past Medical History:  Diagnosis Date  . CAD (coronary artery disease)    a. moderate CAD with 70% prox LAD, 75% mLAD (not physiologically significant by FFR), 70% D1, 75% OM1 (relatively small vessels) - medical therapy recommended, reserving PCI for refractory symptoms.  . Diabetes mellitus type 2 in obese ([place])   . Former tobacco use   . Hyperlipidemia   . Hypertension   . Microcytic anemia    a. noted on labs 08/2018.  . Obesity    Past Surgical History:  Procedure Laterality Date  . CESAREAN SECTION    . INTRAVASCULAR PRESSURE WIRE/FFR STUDY N/A 09/06/2018   Procedure: INTRAVASCULAR PRESSURE WIRE/FFR STUDY;  Surgeon: Lorretta Harp, MD;  Location: [facility];  Service: Cardiovascular;  Laterality: N/A;  . LEFT HEART CATH AND CORONARY ANGIOGRAPHY N/A 09/06/2018   Procedure: LEFT HEART CATH AND CORONARY ANGIOGRAPHY;  Surgeon: Lorretta Harp, MD;  Location: [facility];  Service: Cardiovascular;  Laterality: N/A;     Current Meds  Medication Sig  . acetaminophen (TYLENOL) 500 MG tablet Take 1,000 mg by mouth at bedtime.   Marland Kitchen amLODipine (NORVASC) 2.5 MG tablet Take 1 tablet (2.5 mg total) by mouth at bedtime for 30 days.  Marland Kitchen amoxicillin-clavulanate (AUGMENTIN) 875-125 MG tablet Take 1 tablet by mouth 2 (two) times daily. FOR 10 DAYS  .  aspirin EC 81 MG tablet Take 1 tablet (81 mg total) by mouth daily for 30 days.  Marland Kitchen atorvastatin (LIPITOR) 80 MG tablet Take 1 tablet (80 mg total) by mouth daily at 6 (six) AM for 30 days.  . metoprolol tartrate (LOPRESSOR) 25 MG tablet Take 1 tablet (25 mg total) by mouth 2 (two) times daily for 30 days.  . naproxen (NAPROSYN) 500 MG tablet Take 500 mg by mouth 2 (two) times daily as needed (for pain).  . nitroGLYCERIN (NITROSTAT) 0.4 MG SL tablet Place 1 tablet (0.4 mg total) under the tongue every 5 (five) minutes as needed for chest pain.  . pregabalin (LYRICA) 50 MG capsule Take 50-100 mg by mouth See admin instructions. Take 50 mg by mouth in the morning and 100 mg at bedtime  . SitaGLIPtin-MetFORMIN HCl (JANUMET XR) [phone] MG TB24 Take 1 tablet by mouth at bedtime.  . valsartan-hydrochlorothiazide (DIOVAN-HCT) 320-12.5 MG tablet Take 1 tablet by mouth daily.  . [DISCONTINUED] amLODipine (NORVASC) 2.5 MG tablet Take 1 tablet (2.5 mg total) by mouth at bedtime for 30 days.  . [DISCONTINUED] atorvastatin (LIPITOR) 80 MG tablet Take 1 tablet (80 mg total) by mouth daily at 6 (six) AM for 30 days.  . [DISCONTINUED] clopidogrel (PLAVIX) 75 MG tablet Take 1 tablet (75 mg total) by mouth daily with breakfast for 30 days.  . [DISCONTINUED] metoprolol tartrate (LOPRESSOR) 25 MG tablet Take 1 tablet (25 mg total) by mouth 2 (two) times daily for 30 days.  . [DISCONTINUED] valsartan-hydrochlorothiazide (DIOVAN-HCT) 320-12.5 MG tablet Take 1 tablet by mouth daily.     Allergies:   Sulfa antibiotics and Sulfasalazine   Social History   Tobacco Use  . Smoking status: Former Research scientist (life sciences)  . Smokeless tobacco: Never Used  Substance Use Topics  . Alcohol use: Yes    Alcohol/week: 10.0 standard drinks    Types: 10 Glasses of wine per week  . Drug use: No     Family Hx: The patient's family history includes CAD in her father; Heart attack in her father.  ROS:   Please see the history of present  illness.     All other systems reviewed and are negative.   Prior CV studies:   The following studies were reviewed today:  Heart Cath 09/06/18:  Prox LAD lesion is 70% stenosed.  Ost 1st Diag lesion is 70% stenosed.  Mid LAD lesion is 75% stenosed.  Ost 1st Mrg to 1st Mrg lesion is 75% stenosed.  The left ventricular systolic function is normal.  LV end diastolic pressure is normal.  The left ventricular ejection fraction is 55-65% by visual estimate   Labs/Other Tests and Data Reviewed:    EKG:  Reviewed from 09/07/18 - NSR with nonspecific changes.  Recent Labs: 09/05/2018: ALT 33 09/07/2018: BUN 7; Creatinine, Ser 0.82; Hemoglobin 9.9; Platelets 318; Potassium 3.9; Sodium 138   Recent Lipid Panel Lab Results  Component Value Date/Time   CHOL 194 09/06/2018 02:36 AM   TRIG 86 09/06/2018 02:36 AM   HDL 46 09/06/2018 02:36  AM   CHOLHDL 4.2 09/06/2018 02:36 AM   LDLCALC 131 (H) 09/06/2018 02:36 AM    Wt Readings from Last 3 Encounters:  09/13/18 159 lb (72.1 kg)  09/06/18 160 lb 1.6 oz (72.6 kg)  06/07/12 164 lb (74.4 kg)     Objective:    Vital Signs:  BP (!) 105/53   Pulse 72   Ht 4\' 11"  (1.499 m)   Wt 159 lb (72.1 kg)   BMI 32.11 kg/m    VITAL SIGNS:  reviewed GEN:  no acute distress EYES:  sclerae anicteric, EOMI - Extraocular Movements Intact RESPIRATORY:  normal respiratory effort, symmetric expansion CARDIOVASCULAR:  no peripheral edema SKIN:  no rash, lesions or ulcers. MUSCULOSKELETAL:  no obvious deformities. NEURO:  alert and oriented x 3, no obvious focal deficit PSYCH:  normal affect  ASSESSMENT & PLAN:    1. CAD She denies anginal symptoms and is doing well. Her groin site is still tender, but healing. She has been taking both ASA and plavix. She does not have an ASA allergy. I confirmed with Dr. Meda Coffee that she only needs to take ASA. I will ask her to discontinue plavix.  We also discussed her nitro prescription.  2.  Hyperlipidemia 09/06/2018: Cholesterol 194; HDL 46; LDL Cholesterol 131; Triglycerides 86; VLDL 17 She was started on 80 mg lipitor. Will need repeat fasting lipids and LFTs in 6 weeks. Order placed.  3. Hypertension On valsartan-HCTZ, lopressor 25 mg BID, and norvasc 2.5 mg daily. Pressures have been well controlled. She does not have symptoms of hypotension. No medication changes today.  4. Microcytic anemia Hemoglobin at discharge was 9.9 (10.0).  I do not see a recent CBC in epic for comparison. KPN also checked. She states this is not a new problem for her. Her PCP follows this. She has no signs of bleeding.   COVID-19 Education: The signs and symptoms of COVID-19 were discussed with the patient and how to seek care for testing (follow up with PCP or arrange E-visit).  The importance of social distancing was discussed today.   Time:   Today, I have spent 21 minutes with the patient with telehealth technology discussing the above problems.     Medication Adjustments/Labs and Tests Ordered: Current medicines are reviewed at length with the patient today.  Concerns regarding medicines are outlined above.   Tests Ordered: Orders Placed This Encounter  Procedures  . Lipid Profile  . Hepatic function panel    Medication Changes: Meds ordered this encounter  Medications  . amLODipine (NORVASC) 2.5 MG tablet    Sig: Take 1 tablet (2.5 mg total) by mouth at bedtime for 30 days.    Dispense:  90 tablet    Refill:  1  . atorvastatin (LIPITOR) 80 MG tablet    Sig: Take 1 tablet (80 mg total) by mouth daily at 6 (six) AM for 30 days.    Dispense:  90 tablet    Refill:  1  . metoprolol tartrate (LOPRESSOR) 25 MG tablet    Sig: Take 1 tablet (25 mg total) by mouth 2 (two) times daily for 30 days.    Dispense:  180 tablet    Refill:  1  . valsartan-hydrochlorothiazide (DIOVAN-HCT) 320-12.5 MG tablet    Sig: Take 1 tablet by mouth daily.    Dispense:  90 tablet    Refill:  1     Disposition:  Follow up in 4 month(s) with Dr. Meda Coffee.  Signed, Ledora Bottcher, PA  09/13/2018 10:11 AM    [facility]

## 2018-09-12 ENCOUNTER — Telehealth: Payer: Self-pay | Admitting: *Deleted

## 2018-09-12 NOTE — Telephone Encounter (Signed)
Patient set up for MyChart?  YES - SENDING CONSENT TO MYCHART- ALSO RECEIVED VERBAL CONSENT  Is patient using Smartphone/computer/tablet?COMPUTER   Did audio/video work?DOXY ME  Does patient need telephone visit? NO  Best phone number to use?  [phone]     Virtual Visit Pre-Appointment Phone Call  "(Name), I am calling you today to discuss your upcoming appointment. We are currently trying to limit exposure to the virus that causes COVID-19 by seeing patients at home rather than in the office."  1. "What is the BEST phone number to call the day of the visit?" - include this in appointment notes  2. Do you have or have access to (through a family member/friend) a smartphone with video capability that we can use for your visit?" a. If yes - list this number in appt notes as cell (if different from BEST phone #) and list the appointment type as a VIDEO visit in appointment notes b. If no - list the appointment type as a PHONE visit in appointment notes  3. Confirm consent - "In the setting of the current Covid19 crisis, you are scheduled for a (phone or video) visit with your provider on (date) at (time).  Just as we do with many in-office visits, in order for you to participate in this visit, we must obtain consent.  If you'd like, I can send this to your mychart (if signed up) or email for you to review.  Otherwise, I can obtain your verbal consent now.  All virtual visits are billed to your insurance company just like a normal visit would be.  By agreeing to a virtual visit, we'd like you to understand that the technology does not allow for your provider to perform an examination, and thus may limit your provider's ability to fully assess your condition. If your provider identifies any concerns that need to be evaluated in person, we will make arrangements to do so.  Finally, though the technology is pretty good, we cannot assure that it will always work on either your or our end, and in  the setting of a video visit, we may have to convert it to a phone-only visit.  In either situation, we cannot ensure that we have a secure connection.  Are you willing to proceed?" STAFF: Did the patient verbally acknowledge consent to telehealth visit? Document YES/NO here: YES   4. Advise patient to be prepared - "Two hours prior to your appointment, go ahead and check your blood pressure, pulse, oxygen saturation, and your weight (if you have the equipment to check those) and write them all down. When your visit starts, your provider will ask you for this information. If you have an Apple Watch or Kardia device, please plan to have heart rate information ready on the day of your appointment. Please have a pen and paper handy nearby the day of the visit as well."  5. Give patient instructions for MyChart download to smartphone OR Doximity/[email or web address] as below if video visit (depending on what platform provider is using)  6. Inform patient they will receive a phone call 15 minutes prior to their appointment time (may be from unknown caller ID) so they should be prepared to answer    TELEPHONE CALL NOTE  Maureen Bishop has been deemed a candidate for a follow-up tele-health visit to limit community exposure during the Covid-19 pandemic. I spoke with the patient via phone to ensure availability of phone/video source, confirm preferred email & phone number, and discuss  instructions and expectations.  I reminded Maureen Bishop to be prepared with any vital sign and/or heart rhythm information that could potentially be obtained via home monitoring, at the time of her visit. I reminded Maureen Bishop to expect a phone call prior to her visit.  [name] 09/12/2018 9:11 AM   INSTRUCTIONS FOR DOWNLOADING THE MYCHART APP TO SMARTPHONE  - The patient must first make sure to have activated MyChart and know their login information - If Apple, go to [employer] and type in MyChart in the search bar and  download the app. If Android, ask patient to go to [employer] and type in [place] in the search bar and download the app. The app is free but as with any other app downloads, their phone may require them to verify saved payment information or Apple/Android password.  - The patient will need to then log into the app with their MyChart username and password, and select [facility] as their healthcare provider to link the account. When it is time for your visit, go to the MyChart app, find appointments, and click Begin Video Visit. Be sure to Select Allow for your device to access the Microphone and Camera for your visit. You will then be connected, and your provider will be with you shortly.  **If they have any issues connecting, or need assistance please contact MyChart service desk [phone])**  **If using a computer, in order to ensure the best quality for their visit they will need to use either of the following Internet Browsers: [employer], or Google Chrome**  IF USING DOXIMITY or DOXY.ME - The patient will receive a link just prior to their visit by text.     FULL LENGTH CONSENT FOR TELE-HEALTH VISIT   I hereby voluntarily request, consent and authorize [facility] and its employed or contracted physicians, physician assistants, nurse practitioners or other licensed health care professionals (the Practitioner), to provide me with telemedicine health care services (the Services") as deemed necessary by the treating Practitioner. I acknowledge and consent to receive the Services by the Practitioner via telemedicine. I understand that the telemedicine visit will involve communicating with the Practitioner through live audiovisual communication technology and the disclosure of certain medical information by electronic transmission. I acknowledge that I have been given the opportunity to request an in-person assessment or other available alternative prior to the  telemedicine visit and am voluntarily participating in the telemedicine visit.  I understand that I have the right to withhold or withdraw my consent to the use of telemedicine in the course of my care at any time, without affecting my right to future care or treatment, and that the Practitioner or I may terminate the telemedicine visit at any time. I understand that I have the right to inspect all information obtained and/or recorded in the course of the telemedicine visit and may receive copies of available information for a reasonable fee.  I understand that some of the potential risks of receiving the Services via telemedicine include:   Delay or interruption in medical evaluation due to technological equipment failure or disruption;  Information transmitted may not be sufficient (e.g. poor resolution of images) to allow for appropriate medical decision making by the Practitioner; and/or   In rare instances, security protocols could fail, causing a breach of personal health information.  Furthermore, I acknowledge that it is my responsibility to provide information about my medical history, conditions and care that is complete and accurate  to the best of my ability. I acknowledge that Practitioner's advice, recommendations, and/or decision may be based on factors not within their control, such as incomplete or inaccurate data provided by me or distortions of diagnostic images or specimens that may result from electronic transmissions. I understand that the practice of medicine is not an exact science and that Practitioner makes no warranties or guarantees regarding treatment outcomes. I acknowledge that I will receive a copy of this consent concurrently upon execution via email to the email address I last provided but may also request a printed copy by calling the office of [facility].    I understand that my insurance will be billed for this visit.   I have read or had this consent read to  me.  I understand the contents of this consent, which adequately explains the benefits and risks of the Services being provided via telemedicine.   I have been provided ample opportunity to ask questions regarding this consent and the Services and have had my questions answered to my satisfaction.  I give my informed consent for the services to be provided through the use of telemedicine in my medical care  By participating in this telemedicine visit I agree to the above.

## 2018-09-12 NOTE — Telephone Encounter (Signed)
Call placed to pt regarding her virtual visit schedule with Melina Copa, PA-C, 09/14/2018. Left a message for pt to call back. Will need to reschedule.

## 2018-09-12 NOTE — Telephone Encounter (Signed)
There has been a change in providers, but will we will keep her appt at the same time.

## 2018-09-13 ENCOUNTER — Telehealth (HOSPITAL_COMMUNITY): Payer: Self-pay | Admitting: *Deleted

## 2018-09-13 ENCOUNTER — Telehealth (INDEPENDENT_AMBULATORY_CARE_PROVIDER_SITE_OTHER): Payer: Medicare HMO | Admitting: Physician Assistant

## 2018-09-13 ENCOUNTER — Other Ambulatory Visit: Payer: Self-pay

## 2018-09-13 ENCOUNTER — Encounter: Payer: Self-pay | Admitting: Physician Assistant

## 2018-09-13 VITALS — BP 105/53 | HR 72 | Ht 59.0 in | Wt 159.0 lb

## 2018-09-13 DIAGNOSIS — D509 Iron deficiency anemia, unspecified: Secondary | ICD-10-CM

## 2018-09-13 DIAGNOSIS — E785 Hyperlipidemia, unspecified: Secondary | ICD-10-CM

## 2018-09-13 DIAGNOSIS — I10 Essential (primary) hypertension: Secondary | ICD-10-CM

## 2018-09-13 DIAGNOSIS — I2510 Atherosclerotic heart disease of native coronary artery without angina pectoris: Secondary | ICD-10-CM

## 2018-09-13 MED ORDER — ATORVASTATIN CALCIUM 80 MG PO TABS: 80.0000 mg | ORAL_TABLET | Freq: Every day | ORAL | 1 refills | Status: DC

## 2018-09-13 MED ORDER — AMLODIPINE BESYLATE 2.5 MG PO TABS: 2.5000 mg | ORAL_TABLET | Freq: Every day | ORAL | 1 refills | Status: AC

## 2018-09-13 MED ORDER — VALSARTAN-HYDROCHLOROTHIAZIDE 320-12.5 MG PO TABS: 1.0000 | ORAL_TABLET | Freq: Every day | ORAL | 1 refills | Status: DC

## 2018-09-13 MED ORDER — METOPROLOL TARTRATE 25 MG PO TABS: 25.0000 mg | ORAL_TABLET | Freq: Two times a day (BID) | ORAL | 1 refills | Status: AC

## 2018-09-13 NOTE — Patient Instructions (Addendum)
Medication Instructions:  Your physician has recommended you make the following change in your medication:  1.  STOP the Plavix BUT CONTINUE WITH THE ASPIRIN EVERYDAY   We have sent in medication refills to [facility] for the following medications: Amolodipine Atorvastatin Metoprolol Valsartan-Hctz   If you need a refill on your cardiac medications before your next appointment, please call your pharmacy.   Lab work: Labs to be drawn at the cardiology office 10/25/2018: FASTING LIPID & LFT  If you have labs (blood work) drawn today and your tests are completely normal, you will receive your results only by: Marland Kitchen MyChart Message (if you have MyChart) OR . A paper copy in the mail If you have any lab test that is abnormal or we need to change your treatment, we will call you to review the results.  Testing/Procedures: None ordered   Follow-Up: At [facility], you and your health needs are our priority.  As part of our continuing mission to provide you with exceptional heart care, we have created designated Provider Care Teams.  These Care Teams include your primary Cardiologist (physician) and Advanced Practice Providers (APPs -  Physician Assistants and Nurse Practitioners) who all work together to provide you with the care you need, when you need it. You will need a follow up appointment in 6 months.  Please call our office 2 months in advance to schedule this appointment.  You may see Ena Dawley, MD or one of the following Advanced Practice Providers on your designated Care Team:   [place], PA-C Melina Copa, PA-C . Ermalinda Barrios, PA-C  Any Other Special Instructions Will Be Listed Below (If Applicable).

## 2018-09-13 NOTE — Addendum Note (Signed)
Addended by: Minette Brine on: 09/13/2018 10:11 AM   Modules accepted: Orders

## 2018-09-13 NOTE — Telephone Encounter (Signed)
Called and left message regarding general well being and  Cardiac Rehab continued closure due to adherence of national recommendation for Covid -19  in group settings. Requested call back for interest and any educational needs.  Contact information provided. Cherre Huger, BSN Cardiac and Training and development officer

## 2018-09-14 DIAGNOSIS — J069 Acute upper respiratory infection, unspecified: Secondary | ICD-10-CM | POA: Diagnosis not present

## 2018-09-28 DIAGNOSIS — F064 Anxiety disorder due to known physiological condition: Secondary | ICD-10-CM | POA: Diagnosis not present

## 2018-09-28 DIAGNOSIS — I10 Essential (primary) hypertension: Secondary | ICD-10-CM | POA: Diagnosis not present

## 2018-10-05 ENCOUNTER — Telehealth (HOSPITAL_COMMUNITY): Payer: Self-pay | Admitting: *Deleted

## 2018-10-05 NOTE — Telephone Encounter (Signed)
[id number] Contacted patient regarding Phase 2 Cardiac Rehab referral. Informed patient that due to COVID-19 restrictions, Cardiac Rehab is temporarily closed to patients, but that we will be contacting patient to enroll once the program reopens. Pt verbalizes understanding. Pt is currently walking 20-30 minutes daily and is very happy with her progress. Pt plans to continue walking with her daughter. Will mail patient heart healthy diet packet and exercise packet with warm-up/cool-down stretches that she can do at home in addition to her walking. Will continue to follow.  Sol Passer, MS, ACSM CEP

## 2018-10-08 DIAGNOSIS — I10 Essential (primary) hypertension: Secondary | ICD-10-CM | POA: Diagnosis not present

## 2018-10-08 DIAGNOSIS — F330 Major depressive disorder, recurrent, mild: Secondary | ICD-10-CM | POA: Diagnosis not present

## 2018-10-08 DIAGNOSIS — E782 Mixed hyperlipidemia: Secondary | ICD-10-CM | POA: Diagnosis not present

## 2018-10-12 DIAGNOSIS — R8289 Other abnormal findings on cytological and histological examination of urine: Secondary | ICD-10-CM | POA: Diagnosis not present

## 2018-10-13 DIAGNOSIS — K625 Hemorrhage of anus and rectum: Secondary | ICD-10-CM | POA: Diagnosis not present

## 2018-10-13 DIAGNOSIS — D649 Anemia, unspecified: Secondary | ICD-10-CM | POA: Diagnosis not present

## 2018-10-13 DIAGNOSIS — F330 Major depressive disorder, recurrent, mild: Secondary | ICD-10-CM | POA: Diagnosis not present

## 2018-10-13 DIAGNOSIS — F1020 Alcohol dependence, uncomplicated: Secondary | ICD-10-CM | POA: Diagnosis not present

## 2018-10-13 DIAGNOSIS — E118 Type 2 diabetes mellitus with unspecified complications: Secondary | ICD-10-CM | POA: Diagnosis not present

## 2018-10-13 DIAGNOSIS — E1140 Type 2 diabetes mellitus with diabetic neuropathy, unspecified: Secondary | ICD-10-CM | POA: Diagnosis not present

## 2018-10-13 DIAGNOSIS — E782 Mixed hyperlipidemia: Secondary | ICD-10-CM | POA: Diagnosis not present

## 2018-10-13 DIAGNOSIS — I10 Essential (primary) hypertension: Secondary | ICD-10-CM | POA: Diagnosis not present

## 2018-10-13 DIAGNOSIS — J069 Acute upper respiratory infection, unspecified: Secondary | ICD-10-CM | POA: Diagnosis not present

## 2018-10-22 DIAGNOSIS — M130 Polyarthritis, unspecified: Secondary | ICD-10-CM | POA: Diagnosis not present

## 2018-10-24 ENCOUNTER — Telehealth: Payer: Self-pay | Admitting: *Deleted

## 2018-10-24 NOTE — Telephone Encounter (Signed)
    COVID-19 Pre-Screening Questions:  . In the past 7 to 10 days have you had a cough,  shortness of breath, headache, congestion, fever (100 or greater) body aches, chills, sore throat, or sudden loss of taste or sense of smell? . Have you been around anyone with known Covid 19. . Have you been around anyone who is awaiting Covid 19 test results in the past 7 to 10 days? . Have you been around anyone who has been exposed to Covid 19, or has mentioned symptoms of Covid 19 within the past 7 to 10 days?  If you have any concerns/questions about symptoms patients report during screening (either on the phone or at threshold). Contact the provider seeing the patient or DOD for further guidance.  If neither are available contact a member of the leadership team.           Contacted patient via phone call. No to all Covid 19 questions. Has a mask. KB  

## 2018-10-25 ENCOUNTER — Other Ambulatory Visit: Payer: Self-pay

## 2018-10-25 ENCOUNTER — Other Ambulatory Visit: Payer: Medicare HMO | Admitting: *Deleted

## 2018-10-25 DIAGNOSIS — E785 Hyperlipidemia, unspecified: Secondary | ICD-10-CM | POA: Diagnosis not present

## 2018-10-26 LAB — HEPATIC FUNCTION PANEL
ALT: 28 IU/L (ref 0–32)
AST: 26 IU/L (ref 0–40)
Albumin: 4.3 g/dL (ref 3.8–4.8)
Alkaline Phosphatase: 72 IU/L (ref 39–117)
Bilirubin Total: 0.3 mg/dL (ref 0.0–1.2)
Bilirubin, Direct: 0.12 mg/dL (ref 0.00–0.40)
Total Protein: 6.6 g/dL (ref 6.0–8.5)

## 2018-10-26 LAB — LIPID PANEL
Chol/HDL Ratio: 3.1 ratio (ref 0.0–4.4)
Cholesterol, Total: 137 mg/dL (ref 100–199)
HDL: 44 mg/dL (ref >39)
LDL Calculated: 79 mg/dL (ref 0–99)
Triglycerides: 71 mg/dL (ref 0–149)
VLDL Cholesterol Cal: 14 mg/dL (ref 5–40)

## 2018-11-06 ENCOUNTER — Telehealth (HOSPITAL_COMMUNITY): Payer: Self-pay

## 2018-11-06 DIAGNOSIS — N3000 Acute cystitis without hematuria: Secondary | ICD-10-CM | POA: Diagnosis not present

## 2018-11-06 DIAGNOSIS — R3121 Asymptomatic microscopic hematuria: Secondary | ICD-10-CM | POA: Diagnosis not present

## 2018-11-06 DIAGNOSIS — R3915 Urgency of urination: Secondary | ICD-10-CM | POA: Diagnosis not present

## 2018-11-06 NOTE — Telephone Encounter (Signed)
Called and spoke with pt in regards to our Virtual Cardiac Rehab, pt stated she will give [place] a call back later. She was heading out for a doctor appointment.  If pt does not follow up, will follow up with her in a week.

## 2018-11-07 ENCOUNTER — Encounter (HOSPITAL_COMMUNITY): Payer: Self-pay

## 2018-11-07 ENCOUNTER — Telehealth (HOSPITAL_COMMUNITY): Payer: Self-pay

## 2018-11-07 NOTE — Telephone Encounter (Signed)
No response from pt, message left on voicemail regarding Virtual Cardiac Rehab for the continuation of her participation.    Please contact letter sent to address on file in epic. Await response, if no response by 11/21/2018 will discharge pt from the program.

## 2018-11-12 DIAGNOSIS — Z803 Family history of malignant neoplasm of breast: Secondary | ICD-10-CM | POA: Diagnosis not present

## 2018-11-12 DIAGNOSIS — N644 Mastodynia: Secondary | ICD-10-CM | POA: Diagnosis not present

## 2018-11-30 DIAGNOSIS — I700 Atherosclerosis of aorta: Secondary | ICD-10-CM | POA: Diagnosis not present

## 2018-11-30 DIAGNOSIS — Z87440 Personal history of urinary (tract) infections: Secondary | ICD-10-CM | POA: Diagnosis not present

## 2018-11-30 DIAGNOSIS — R3121 Asymptomatic microscopic hematuria: Secondary | ICD-10-CM | POA: Diagnosis not present

## 2018-11-30 DIAGNOSIS — K5730 Diverticulosis of large intestine without perforation or abscess without bleeding: Secondary | ICD-10-CM | POA: Diagnosis not present

## 2018-12-03 ENCOUNTER — Ambulatory Visit: Payer: Medicare HMO | Admitting: Internal Medicine

## 2018-12-04 DIAGNOSIS — N3021 Other chronic cystitis with hematuria: Secondary | ICD-10-CM | POA: Diagnosis not present

## 2018-12-04 DIAGNOSIS — N952 Postmenopausal atrophic vaginitis: Secondary | ICD-10-CM | POA: Diagnosis not present

## 2018-12-04 DIAGNOSIS — R8271 Bacteriuria: Secondary | ICD-10-CM | POA: Diagnosis not present

## 2018-12-06 ENCOUNTER — Other Ambulatory Visit: Payer: Self-pay | Admitting: Cardiology

## 2018-12-06 DIAGNOSIS — I10 Essential (primary) hypertension: Secondary | ICD-10-CM

## 2018-12-06 DIAGNOSIS — I2510 Atherosclerotic heart disease of native coronary artery without angina pectoris: Secondary | ICD-10-CM

## 2018-12-25 DIAGNOSIS — N644 Mastodynia: Secondary | ICD-10-CM | POA: Diagnosis not present

## 2019-02-06 NOTE — Progress Notes (Signed)
Virtual Visit via Video Note   This visit type was conducted due to national recommendations for restrictions regarding the COVID-19 Pandemic (e.g. social distancing) in an effort to limit this patient's exposure and mitigate transmission in our community.  Due to her co-morbid illnesses, this patient is at least at moderate risk for complications without adequate follow up.  This format is felt to be most appropriate for this patient at this time.  All issues noted in this document were discussed and addressed.  A limited physical exam was performed with this format.  Please refer to the patient's chart for her consent to telehealth for [facility].   Evaluation Performed:  5 months Follow-up visit  Date:  02/08/2019   ID:  Maureen Bishop, DOB [birth year], MRN [id number]  Patient Location: Home Provider Location: Home  PCP:  Lucianne Lei, MD  Cardiologist:  Maureen Dawley, MD  Electrophysiologist:  None   Chief Complaint: hemorrhoidal bleeding  History of Present Illness:    BLESSYN ZWEIFEL is a 68 y.o. female with HTN, DM, HLD, former smoker, and recently diagnosed moderate CAD and microcytic anemia who presents for post-hospital follow-up via telehealth visit. She was recently admitted with intermittent chest pain and ruled out for MI. Chest CT was negative for PE with mild aortic calcification noted. CXR was unremarkable. She underwent cardiac cath showing moderate CAD with 70% prox LAD, 75% mLAD (not physiologically significant by FFR), 70% D1, 75% OM1 (relatively small vessels) - medical therapy recommended. It ws recommended to reserve PCI for recalcitrant symptoms. She was started on Plavix given ASA allergy although it was on her DC list. She was also started on atorvastatin and metoprolol. Labs showed A1C 6.1, LDL 131, Hgb 9.9 (microcytic - previously 12 range in 2014), K 3.9, Cr 0.82, LFTs wnl.  09/13/2018 - Given her microcytic anemia and nonobstructive CAD, plavix was  discontinued.  02/08/2019 - 5 months follow up, she is doing well, feeling little down from Covid situation, no chest pain or SOB, no LE edema, no palpitations. She is compliant with her meds and has no side effects. She doesn't exercise on a regular basis.   The patient does not have symptoms concerning for COVID-19 infection (fever, chills, cough, or new shortness of breath).    Past Medical History:  Diagnosis Date  . CAD (coronary artery disease)    a. moderate CAD with 70% prox LAD, 75% mLAD (not physiologically significant by FFR), 70% D1, 75% OM1 (relatively small vessels) - medical therapy recommended, reserving PCI for refractory symptoms.  . Diabetes mellitus type 2 in obese ([place])   . Former tobacco use   . Hyperlipidemia   . Hypertension   . Microcytic anemia    a. noted on labs 08/2018.  . Obesity    Past Surgical History:  Procedure Laterality Date  . CESAREAN SECTION    . INTRAVASCULAR PRESSURE WIRE/FFR STUDY N/A 09/06/2018   Procedure: INTRAVASCULAR PRESSURE WIRE/FFR STUDY;  Surgeon: Lorretta Harp, MD;  Location: [place] [facility];  Service: Cardiovascular;  Laterality: N/A;  . LEFT HEART CATH AND CORONARY ANGIOGRAPHY N/A 09/06/2018   Procedure: LEFT HEART CATH AND CORONARY ANGIOGRAPHY;  Surgeon: Lorretta Harp, MD;  Location: [place] [facility];  Service: Cardiovascular;  Laterality: N/A;     Current Meds  Medication Sig  . acetaminophen (TYLENOL) 500 MG tablet Take 1,000 mg by mouth at bedtime.   . clopidogrel (PLAVIX) 75 MG tablet Take 75 mg by mouth daily.  Marland Kitchen  naproxen (NAPROSYN) 500 MG tablet Take 500 mg by mouth 2 (two) times daily as needed (for pain).  . pregabalin (LYRICA) 50 MG capsule Take 50-100 mg by mouth See admin instructions. Take 50 mg by mouth in the morning and 100 mg at bedtime  . SitaGLIPtin-MetFORMIN HCl (JANUMET XR) [phone] MG TB24 Take 1 tablet by mouth at bedtime.     Allergies:   Sulfa antibiotics and Sulfasalazine   Social History    Tobacco Use  . Smoking status: Former Research scientist (life sciences)  . Smokeless tobacco: Never Used  Substance Use Topics  . Alcohol use: Yes    Alcohol/week: 10.0 standard drinks    Types: 10 Glasses of wine per week  . Drug use: No     Family Hx: The patient's family history includes CAD in her father; Heart attack in her father.  ROS:   Please see the history of present illness.     All other systems reviewed and are negative.   Prior CV studies:   The following studies were reviewed today:  Heart Cath 09/06/18:  Prox LAD lesion is 70% stenosed.  Ost 1st Diag lesion is 70% stenosed.  Mid LAD lesion is 75% stenosed.  Ost 1st Mrg to 1st Mrg lesion is 75% stenosed.  The left ventricular systolic function is normal.  LV end diastolic pressure is normal.  The left ventricular ejection fraction is 55-65% by visual estimate   Labs/Other Tests and Data Reviewed:    EKG:  Reviewed from 09/07/18 - NSR with nonspecific changes.  Recent Labs: 09/07/2018: BUN 7; Creatinine, Ser 0.82; Hemoglobin 9.9; Platelets 318; Potassium 3.9; Sodium 138 10/25/2018: ALT 28   Recent Lipid Panel Lab Results  Component Value Date/Time   CHOL 137 10/25/2018 01:38 PM   TRIG 71 10/25/2018 01:38 PM   HDL 44 10/25/2018 01:38 PM   CHOLHDL 3.1 10/25/2018 01:38 PM   CHOLHDL 4.2 09/06/2018 02:36 AM   LDLCALC 79 10/25/2018 01:38 PM    Wt Readings from Last 3 Encounters:  02/08/19 164 lb (74.4 kg)  09/13/18 159 lb (72.1 kg)  09/06/18 160 lb 1.6 oz (72.6 kg)     Objective:    Vital Signs:  BP (!) 154/67   Pulse (!) 55   Temp (!) 97.1 F (36.2 C)   Wt 164 lb (74.4 kg)   BMI 33.12 kg/m    VITAL SIGNS:  reviewed GEN:  no acute distress EYES:  sclerae anicteric, EOMI - Extraocular Movements Intact RESPIRATORY:  normal respiratory effort, symmetric expansion CARDIOVASCULAR:  no peripheral edema SKIN:  no rash, lesions or ulcers. MUSCULOSKELETAL:  no obvious deformities. NEURO:  alert and oriented x 3,  no obvious focal deficit PSYCH:  normal affect    ASSESSMENT & PLAN:    1. CAD Asymptomatic, on Plavix only. Advised to walk 3-5x/week.  2. Hyperlipidemia 09/06/2018: Cholesterol 194; HDL 46; LDL Cholesterol 131; Triglycerides 86; VLDL 17, she was started on 80 mg lipitor. Repeat labs on 10/25/2018 : LDL 79 HDL 44, TG: 71.  We will continue the same regimen. She has no side effects.  3. Hypertension Well controlled on home diary, no meds today yet.  4. Microcytic anemia Hemoglobin at discharge was 9.9 (10.0), microcytic, she had GI eval in July, we will prescribe FeS 325 mg PO BID for 2 month, she has frequent bleeding from hemorrhoids.   COVID-19 Education: The signs and symptoms of COVID-19 were discussed with the patient and how to seek care for testing (follow up with  PCP or arrange E-visit).  The importance of social distancing was discussed today.  Time:   Today, I have spent 21 minutes with the patient with telehealth technology discussing the above problems.     Medication Adjustments/Labs and Tests Ordered: Current medicines are reviewed at length with the patient today.  Concerns regarding medicines are outlined above.   Tests Ordered: No orders of the defined types were placed in this encounter.   Medication Changes: No orders of the defined types were placed in this encounter.   Disposition:  Follow up in 4 month(s) with Dr. Meda Coffee.  Signed, Maureen Dawley, MD  02/08/2019 10:21 AM    [place]

## 2019-02-07 ENCOUNTER — Encounter: Payer: Self-pay | Admitting: *Deleted

## 2019-02-07 ENCOUNTER — Telehealth: Payer: Self-pay | Admitting: *Deleted

## 2019-02-07 NOTE — Telephone Encounter (Signed)
Virtual Visit Pre-Appointment Phone Call  "(Name), I am calling you today to discuss your upcoming appointment. We are currently trying to limit exposure to the virus that causes COVID-19 by seeing patients at home rather than in the office."  1. "What is the BEST phone number to call the day of the visit?" - include this in appointment notes-YES NOTED  2. Do you have or have access to (through a family member/friend) a smartphone with video capability that we can use for your visit?" a. If yes - list this number in appt notes as cell (if different from BEST phone #) and list the appointment type as a VIDEO visit in appointment notes-YES UPDATED   3. Confirm consent - "In the setting of the current Covid19 crisis, you are scheduled for a ( video) visit with Dr. Meda Coffee on 02/08/19 at [facility].  Just as we do with many in-office visits, in order for you to participate in this visit, we must obtain consent.  If you'd like, I can send this to your mychart (if signed up) or email for you to review.  Otherwise, I can obtain your verbal consent now.  All virtual visits are billed to your insurance company just like a normal visit would be.  By agreeing to a virtual visit, we'd like you to understand that the technology does not allow for your provider to perform an examination, and thus may limit your provider's ability to fully assess your condition. If your provider identifies any concerns that need to be evaluated in person, we will make arrangements to do so.  Finally, though the technology is pretty good, we cannot assure that it will always work on either your or our end, and in the setting of a video visit, we may have to convert it to a phone-only visit.  In either situation, we cannot ensure that we have a secure connection.  Are you willing to proceed?" STAFF: Did the patient verbally acknowledge consent to telehealth visit? Document YES/NO here: YES PT GAVE VERBAL CONSENT FOR DR NELSON TO TREAT HER  VIA VIDEO VISIT ON 9/18  4. Advise patient to be prepared - "Two hours prior to your appointment, go ahead and check your blood pressure, pulse, oxygen saturation, and your weight (if you have the equipment to check those) and write them all down. When your visit starts, your provider will ask you for this information. If you have an Apple Watch or Kardia device, please plan to have heart rate information ready on the day of your appointment. Please have a pen and paper handy nearby the day of the visit as well."--YES PT Maureen Bishop   5. Give patient instructions for MyChart download to smartphone OR Doximity/Doxy.me as below if video visit (depending on what platform provider is using)--YES DOXY.ME EXPLAINED  6. Inform patient they will receive a phone call 15 minutes prior to their appointment time (may be from unknown caller ID) so they should be prepared to answer-YES    TELEPHONE CALL NOTE  Maureen Bishop has been deemed a candidate for a follow-up tele-health visit to limit community exposure during the Covid-19 pandemic. I spoke with the patient via phone to ensure availability of phone/video source, confirm preferred email & phone number, and discuss instructions and expectations.  I reminded Maureen Bishop to be prepared with any vital sign and/or heart rhythm information that could potentially be obtained via home monitoring, at the time of her visit. I  reminded Maureen Bishop to expect a phone call prior to her visit.  Maureen Alpha, LPN [id number] PM    IF USING DOXIMITY or DOXY.ME - The patient will receive a link just prior to their visit by text.-YES AWARE     FULL LENGTH CONSENT FOR TELE-HEALTH VISIT   I hereby voluntarily request, consent and authorize [facility] and its employed or contracted physicians, physician assistants, nurse practitioners or other licensed health care professionals (the Practitioner), to provide me with telemedicine health  care services (the Services") as deemed necessary by the treating Practitioner. I acknowledge and consent to receive the Services by the Practitioner via telemedicine. I understand that the telemedicine visit will involve communicating with the Practitioner through live audiovisual communication technology and the disclosure of certain medical information by electronic transmission. I acknowledge that I have been given the opportunity to request an in-person assessment or other available alternative prior to the telemedicine visit and am voluntarily participating in the telemedicine visit.  I understand that I have the right to withhold or withdraw my consent to the use of telemedicine in the course of my care at any time, without affecting my right to future care or treatment, and that the Practitioner or I may terminate the telemedicine visit at any time. I understand that I have the right to inspect all information obtained and/or recorded in the course of the telemedicine visit and may receive copies of available information for a reasonable fee.  I understand that some of the potential risks of receiving the Services via telemedicine include:   Delay or interruption in medical evaluation due to technological equipment failure or disruption;  Information transmitted may not be sufficient (e.g. poor resolution of images) to allow for appropriate medical decision making by the Practitioner; and/or   In rare instances, security protocols could fail, causing a breach of personal health information.  Furthermore, I acknowledge that it is my responsibility to provide information about my medical history, conditions and care that is complete and accurate to the best of my ability. I acknowledge that Practitioner's advice, recommendations, and/or decision may be based on factors not within their control, such as incomplete or inaccurate data provided by me or distortions of diagnostic images or specimens that  may result from electronic transmissions. I understand that the practice of medicine is not an exact science and that Practitioner makes no warranties or guarantees regarding treatment outcomes. I acknowledge that I will receive a copy of this consent concurrently upon execution via email to the email address I last provided but may also request a printed copy by calling the office of [facility].    I understand that my insurance will be billed for this visit.   I have read or had this consent read to me.  I understand the contents of this consent, which adequately explains the benefits and risks of the Services being provided via telemedicine.   I have been provided ample opportunity to ask questions regarding this consent and the Services and have had my questions answered to my satisfaction.  I give my informed consent for the services to be provided through the use of telemedicine in my medical care  By participating in this telemedicine visit I agree to the above.  PT GAVE VERBAL CONSENT FOR DR Meda Coffee TO TREAT HER VIA VIDEO VISIT ON 02/08/19.

## 2019-02-07 NOTE — Telephone Encounter (Signed)
ERRONEOUS ENCOUNTER

## 2019-02-08 ENCOUNTER — Encounter: Payer: Self-pay | Admitting: *Deleted

## 2019-02-08 ENCOUNTER — Encounter: Payer: Self-pay | Admitting: Cardiology

## 2019-02-08 ENCOUNTER — Other Ambulatory Visit: Payer: Self-pay

## 2019-02-08 ENCOUNTER — Telehealth (INDEPENDENT_AMBULATORY_CARE_PROVIDER_SITE_OTHER): Payer: Medicare HMO | Admitting: Cardiology

## 2019-02-08 DIAGNOSIS — E782 Mixed hyperlipidemia: Secondary | ICD-10-CM

## 2019-02-08 DIAGNOSIS — I2510 Atherosclerotic heart disease of native coronary artery without angina pectoris: Secondary | ICD-10-CM | POA: Diagnosis not present

## 2019-02-08 DIAGNOSIS — I10 Essential (primary) hypertension: Secondary | ICD-10-CM

## 2019-02-08 DIAGNOSIS — R0789 Other chest pain: Secondary | ICD-10-CM

## 2019-02-08 MED ORDER — IRON (FERROUS SULFATE) 325 (65 FE) MG PO TABS: 325.0000 mg | ORAL_TABLET | Freq: Two times a day (BID) | ORAL | 0 refills | Status: AC

## 2019-02-08 NOTE — Patient Instructions (Signed)
Medication Instructions:   START TAKING IRON SULFATE 325 MG BY MOUTH TWICE DAILY FOR 2 MONTHS ONLY.  If you need a refill on your cardiac medications before your next appointment, please call your pharmacy.     Follow-Up: At [facility], you and your health needs are our priority.  As part of our continuing mission to provide you with exceptional heart care, we have created designated Provider Care Teams.  These Care Teams include your primary Cardiologist (physician) and Advanced Practice Providers (APPs -  Physician Assistants and Nurse Practitioners) who all work together to provide you with the care you need, when you need it.  Your physician wants you to follow-up in: [place] will receive a reminder letter in the mail two months in advance. If you don't receive a letter, please call our office to schedule the follow-up appointment.  I WILL SEND YOU A LETTER IN THE MAIL IN 4 MONTHS TO CALL THE OFFICE TO GET YOUR 6 MONTH IN PERSON FOLLOW-UP APPOINTMENT WITH DR Meda Coffee ARRANGED.  HER SCHEDULE WILL BE OPEN AT THAT TIME.

## 2019-02-10 DIAGNOSIS — H6502 Acute serous otitis media, left ear: Secondary | ICD-10-CM | POA: Diagnosis not present

## 2019-02-19 DIAGNOSIS — I10 Essential (primary) hypertension: Secondary | ICD-10-CM | POA: Diagnosis not present

## 2019-02-19 DIAGNOSIS — U071 COVID-19: Secondary | ICD-10-CM | POA: Diagnosis not present

## 2019-02-19 DIAGNOSIS — K700 Alcoholic fatty liver: Secondary | ICD-10-CM | POA: Diagnosis not present

## 2019-02-19 DIAGNOSIS — F430 Acute stress reaction: Secondary | ICD-10-CM | POA: Diagnosis not present

## 2019-02-19 DIAGNOSIS — I5030 Unspecified diastolic (congestive) heart failure: Secondary | ICD-10-CM | POA: Diagnosis not present

## 2019-02-19 DIAGNOSIS — E119 Type 2 diabetes mellitus without complications: Secondary | ICD-10-CM | POA: Diagnosis not present

## 2019-02-19 DIAGNOSIS — R0602 Shortness of breath: Secondary | ICD-10-CM | POA: Diagnosis not present

## 2019-02-19 DIAGNOSIS — E1140 Type 2 diabetes mellitus with diabetic neuropathy, unspecified: Secondary | ICD-10-CM | POA: Diagnosis not present

## 2019-03-05 DIAGNOSIS — I1310 Hypertensive heart and chronic kidney disease without heart failure, with stage 1 through stage 4 chronic kidney disease, or unspecified chronic kidney disease: Secondary | ICD-10-CM | POA: Diagnosis not present

## 2019-03-05 DIAGNOSIS — I10 Essential (primary) hypertension: Secondary | ICD-10-CM | POA: Diagnosis not present

## 2019-03-05 DIAGNOSIS — E782 Mixed hyperlipidemia: Secondary | ICD-10-CM | POA: Diagnosis not present

## 2019-03-05 DIAGNOSIS — F330 Major depressive disorder, recurrent, mild: Secondary | ICD-10-CM | POA: Diagnosis not present

## 2019-03-05 DIAGNOSIS — M130 Polyarthritis, unspecified: Secondary | ICD-10-CM | POA: Diagnosis not present

## 2019-03-11 ENCOUNTER — Other Ambulatory Visit: Payer: Self-pay | Admitting: Cardiology

## 2019-03-11 DIAGNOSIS — E785 Hyperlipidemia, unspecified: Secondary | ICD-10-CM

## 2019-03-11 MED ORDER — ATORVASTATIN CALCIUM 80 MG PO TABS: 80.0000 mg | ORAL_TABLET | Freq: Every day | ORAL | 3 refills | Status: DC

## 2019-03-12 DIAGNOSIS — I10 Essential (primary) hypertension: Secondary | ICD-10-CM | POA: Diagnosis not present

## 2019-03-12 DIAGNOSIS — E782 Mixed hyperlipidemia: Secondary | ICD-10-CM | POA: Diagnosis not present

## 2019-03-12 DIAGNOSIS — R7309 Other abnormal glucose: Secondary | ICD-10-CM | POA: Diagnosis not present

## 2019-03-12 DIAGNOSIS — E1140 Type 2 diabetes mellitus with diabetic neuropathy, unspecified: Secondary | ICD-10-CM | POA: Diagnosis not present

## 2019-03-12 DIAGNOSIS — Z23 Encounter for immunization: Secondary | ICD-10-CM | POA: Diagnosis not present

## 2019-04-03 ENCOUNTER — Other Ambulatory Visit: Payer: Self-pay | Admitting: Cardiology

## 2019-04-17 ENCOUNTER — Other Ambulatory Visit: Payer: Self-pay | Admitting: Cardiology

## 2019-04-20 ENCOUNTER — Other Ambulatory Visit: Payer: Self-pay

## 2019-04-20 ENCOUNTER — Ambulatory Visit (HOSPITAL_COMMUNITY)
Admission: EM | Admit: 2019-04-20 | Discharge: 2019-04-20 | Disposition: A | Discharge status: Home / Self Care | Payer: Medicare HMO | Attending: Emergency Medicine | Admitting: Emergency Medicine

## 2019-04-20 ENCOUNTER — Encounter (HOSPITAL_COMMUNITY): Payer: Self-pay

## 2019-04-20 DIAGNOSIS — Z20828 Contact with and (suspected) exposure to other viral communicable diseases: Secondary | ICD-10-CM | POA: Insufficient documentation

## 2019-04-20 DIAGNOSIS — Z20822 Contact with and (suspected) exposure to covid-19: Secondary | ICD-10-CM

## 2019-04-20 NOTE — Discharge Instructions (Signed)
Self isolate until covid results are back and negative.  °Will notify you by phone of any positive findings. Your negative results will be sent through your MyChart.     ° °

## 2019-04-20 NOTE — ED Triage Notes (Signed)
Pt presents to the UC for Covid test. Pt reports her daughter tested positive for Covid today and they were together 3 days ago. Pt denies any sign and symptoms.

## 2019-04-21 ENCOUNTER — Emergency Department (HOSPITAL_COMMUNITY)
Admission: EM | Admit: 2019-04-21 | Discharge: 2019-04-21 | Disposition: A | Discharge status: Home / Self Care | Payer: Medicare HMO | Attending: Emergency Medicine | Admitting: Emergency Medicine

## 2019-04-21 ENCOUNTER — Emergency Department (HOSPITAL_COMMUNITY): Payer: Medicare HMO

## 2019-04-21 ENCOUNTER — Other Ambulatory Visit: Payer: Self-pay

## 2019-04-21 ENCOUNTER — Encounter (HOSPITAL_COMMUNITY): Payer: Self-pay | Admitting: Emergency Medicine

## 2019-04-21 DIAGNOSIS — Z87891 Personal history of nicotine dependence: Secondary | ICD-10-CM | POA: Diagnosis not present

## 2019-04-21 DIAGNOSIS — K219 Gastro-esophageal reflux disease without esophagitis: Secondary | ICD-10-CM

## 2019-04-21 DIAGNOSIS — Z7984 Long term (current) use of oral hypoglycemic drugs: Secondary | ICD-10-CM | POA: Insufficient documentation

## 2019-04-21 DIAGNOSIS — Z79899 Other long term (current) drug therapy: Secondary | ICD-10-CM | POA: Diagnosis not present

## 2019-04-21 DIAGNOSIS — Z7902 Long term (current) use of antithrombotics/antiplatelets: Secondary | ICD-10-CM | POA: Diagnosis not present

## 2019-04-21 DIAGNOSIS — Z20828 Contact with and (suspected) exposure to other viral communicable diseases: Secondary | ICD-10-CM | POA: Diagnosis not present

## 2019-04-21 DIAGNOSIS — I10 Essential (primary) hypertension: Secondary | ICD-10-CM | POA: Diagnosis not present

## 2019-04-21 DIAGNOSIS — R079 Chest pain, unspecified: Secondary | ICD-10-CM | POA: Diagnosis not present

## 2019-04-21 DIAGNOSIS — R0789 Other chest pain: Secondary | ICD-10-CM | POA: Diagnosis not present

## 2019-04-21 DIAGNOSIS — R062 Wheezing: Secondary | ICD-10-CM | POA: Diagnosis not present

## 2019-04-21 DIAGNOSIS — E119 Type 2 diabetes mellitus without complications: Secondary | ICD-10-CM | POA: Insufficient documentation

## 2019-04-21 LAB — BASIC METABOLIC PANEL
Anion gap: 7 (ref 5–15)
BUN: 7 mg/dL — ABNORMAL LOW (ref 8–23)
CO2: 27 mmol/L (ref 22–32)
Calcium: 9.3 mg/dL (ref 8.9–10.3)
Chloride: 108 mmol/L (ref 98–111)
Creatinine, Ser: 0.87 mg/dL (ref 0.44–1.00)
GFR calc Af Amer: >60 mL/min (ref >60)
GFR calc non Af Amer: >60 mL/min (ref >60)
Glucose, Bld: 107 mg/dL — ABNORMAL HIGH (ref 70–99)
Potassium: 4 mmol/L (ref 3.5–5.1)
Sodium: 142 mmol/L (ref 135–145)

## 2019-04-21 LAB — CBC
HCT: 34.6 % — ABNORMAL LOW (ref 36.0–46.0)
Hemoglobin: 10.5 g/dL — ABNORMAL LOW (ref 12.0–15.0)
MCH: 22.6 pg — ABNORMAL LOW (ref 26.0–34.0)
MCHC: 30.3 g/dL (ref 30.0–36.0)
MCV: 74.4 fL — ABNORMAL LOW (ref 80.0–100.0)
Platelets: 289 10*3/uL (ref 150–400)
RBC: 4.65 MIL/uL (ref 3.87–5.11)
RDW: 16.7 % — ABNORMAL HIGH (ref 11.5–15.5)
WBC: 8.3 10*3/uL (ref 4.0–10.5)
nRBC: 0 % (ref 0.0–0.2)

## 2019-04-21 LAB — TROPONIN I (HIGH SENSITIVITY): Troponin I (High Sensitivity): <2 ng/L (ref <18)

## 2019-04-21 MED ORDER — ACETAMINOPHEN 500 MG PO TABS
1000.0000 mg | ORAL_TABLET | Freq: Once | ORAL | Status: AC
  Administered 2019-04-21: 1000 mg via ORAL
  Filled 2019-04-21: qty 2

## 2019-04-21 MED ORDER — ALUM & MAG HYDROXIDE-SIMETH 200-200-20 MG/5ML PO SUSP
30.0000 mL | Freq: Once | ORAL | Status: AC
  Administered 2019-04-21: 19:00:00 30 mL via ORAL
  Filled 2019-04-21: qty 30

## 2019-04-21 MED ORDER — FAMOTIDINE 20 MG PO TABS: 20.0000 mg | ORAL_TABLET | Freq: Two times a day (BID) | ORAL | 0 refills | Status: AC

## 2019-04-21 MED ORDER — SODIUM CHLORIDE 0.9% FLUSH: 3.0000 mL | Freq: Once | INTRAVENOUS | Status: DC

## 2019-04-21 MED ORDER — LIDOCAINE VISCOUS HCL 2 % MT SOLN
15.0000 mL | Freq: Once | OROMUCOSAL | Status: AC
  Administered 2019-04-21: 15 mL via ORAL
  Filled 2019-04-21: qty 15

## 2019-04-21 NOTE — Discharge Instructions (Addendum)
Avoid taking any Aleve, naproxen, ibuprofen products as it can really irritate your stomach.  Try not to eat 2 hours within going to bed.  Also elevating the post of your bed so you are sleeping more at an angle may also help.  There are certain foods you can avoid which were attached to your discharge paperwork.  You can try diclofenac gel as needed for pain and also Tylenol.

## 2019-04-21 NOTE — ED Triage Notes (Signed)
Pt sent to ED with c/o mid chest pain.from Urgent Care.  Pt was at [facility] yesterday to be tested for Covid due to her daughter being positive.  Pt st's she has been having mid chest pain x's 3 days  Pt denies cough but has had some SOB

## 2019-04-21 NOTE — ED Notes (Signed)
Pt dc'd home w/all belongings, a/o x4, ambulatory on dc

## 2019-04-21 NOTE — ED Provider Notes (Signed)
[facility]    CSN: [id number] Arrival date & time: 04/20/19  1528      History   Chief Complaint Chief Complaint  Patient presents with  . covid exposure    HPI Maureen Bishop is a 68 y.o. female.   Maureen Bishop presents with requests for covid-19 screening. She found out today that her daughter tested positive for it after likely exposure at her work place. Maureen Bishop had been around her daughter two days ago. Maureen Bishop's daughter has not had any symptoms but got tested due to her exposure. Maureen Bishop states that a few weeks ago she was started on a new iron medication and had diarrhea, she is concerned that this was related to covid 19. No further diarrhea. She denies  Any other symptoms at this time. She is very anxious about this exposure. History  Of cad, dm, htn.     ROS per HPI, negative if not otherwise mentioned.      Past Medical History:  Diagnosis Date  . CAD (coronary artery disease)    a. moderate CAD with 70% prox LAD, 75% mLAD (not physiologically significant by FFR), 70% D1, 75% OM1 (relatively small vessels) - medical therapy recommended, reserving PCI for refractory symptoms.  . Diabetes mellitus type 2 in obese ([place])   . Former tobacco use   . Hyperlipidemia   . Hypertension   . Microcytic anemia    a. noted on labs 08/2018.  . Obesity     Patient Active Problem List   Diagnosis Date Noted  . Coronary artery disease, non-occlusive 09/13/2018  . Chest pain 09/05/2018  . Benign essential HTN 09/05/2018  . Type 2 diabetes mellitus without complication ([place]) [id number]  . Hyperlipidemia 09/05/2018    Past Surgical History:  Procedure Laterality Date  . CESAREAN SECTION    . INTRAVASCULAR PRESSURE WIRE/FFR STUDY N/A 09/06/2018   Procedure: INTRAVASCULAR PRESSURE WIRE/FFR STUDY;  Surgeon: Lorretta Harp, MD;  Location: [facility];  Service: Cardiovascular;  Laterality: N/A;  . LEFT HEART CATH AND CORONARY ANGIOGRAPHY N/A  09/06/2018   Procedure: LEFT HEART CATH AND CORONARY ANGIOGRAPHY;  Surgeon: Lorretta Harp, MD;  Location: [place] [facility];  Service: Cardiovascular;  Laterality: N/A;    OB History    Gravida  2   Para  2   Term      Preterm      AB      Living  2     SAB      TAB      Ectopic      Multiple      Live Births               Home Medications    Prior to Admission medications   Medication Sig Start Date End Date Taking? Authorizing Provider  acetaminophen (TYLENOL) 500 MG tablet Take 1,000 mg by mouth at bedtime.     [provider]  amLODipine (NORVASC) 2.5 MG tablet Take 1 tablet (2.5 mg total) by mouth at bedtime for 30 days. 09/13/18 10/13/18  Ledora Bottcher, PA  atorvastatin (LIPITOR) 80 MG tablet Take 1 tablet (80 mg total) by mouth daily at 6 (six) AM. 03/11/19   Dorothy Spark, MD  clopidogrel (PLAVIX) 75 MG tablet Take 75 mg by mouth daily.    [provider]  Iron, Ferrous Sulfate, 325 (65 Fe) MG TABS Take 325 mg by mouth 2 (two) times daily. Take for 2 months  only. 02/08/19   Dorothy Spark, MD  metoprolol tartrate (LOPRESSOR) 25 MG tablet Take 1 tablet (25 mg total) by mouth 2 (two) times daily for 30 days. 09/13/18 10/13/18  Ledora Bottcher, PA  naproxen (NAPROSYN) 500 MG tablet Take 500 mg by mouth 2 (two) times daily as needed (for pain).    [provider]  nitroGLYCERIN (NITROSTAT) 0.4 MG SL tablet Place 1 tablet (0.4 mg total) under the tongue every 5 (five) minutes as needed for chest pain. 09/07/18   Amin, Jeanella Flattery, MD  pregabalin (LYRICA) 50 MG capsule Take 50-100 mg by mouth See admin instructions. Take 50 mg by mouth in the morning and 100 mg at bedtime    [provider]  SitaGLIPtin-MetFORMIN HCl (JANUMET XR) [phone] MG TB24 Take 1 tablet by mouth at bedtime.    [provider]    Family History Family History  Problem Relation Age of Onset  . CAD Father   . Heart attack Father         died at 15 y/o from MI     Social History Social History   Tobacco Use  . Smoking status: Former Research scientist (life sciences)  . Smokeless tobacco: Never Used  Substance Use Topics  . Alcohol use: Yes    Alcohol/week: 10.0 standard drinks    Types: 10 Glasses of wine per week  . Drug use: No     Allergies   Sulfa antibiotics and Sulfasalazine   Review of Systems Review of Systems   Physical Exam Triage Vital Signs ED Triage Vitals  Enc Vitals Group     BP 04/20/19 1720 (!) 170/60     Pulse Rate 04/20/19 1720 64     Resp 04/20/19 1720 15     Temp 04/20/19 1720 98.5 F (36.9 C)     Temp Source 04/20/19 1720 Oral     SpO2 04/20/19 1720 98 %     Weight --      Height --      Head Circumference --      Peak Flow --      Pain Score 04/20/19 1719 0     Pain Loc --      Pain Edu? --      Excl. in [place]? --    No data found.  Updated Vital Signs BP (!) 170/60 (BP Location: Left Arm)   Pulse 64   Temp 98.5 F (36.9 C) (Oral)   Resp 15   SpO2 98%   Visual Acuity Right Eye Distance:   Left Eye Distance:   Bilateral Distance:    Right Eye Near:   Left Eye Near:    Bilateral Near:     Physical Exam Constitutional:      General: She is not in acute distress.    Appearance: She is well-developed.     Comments: Anxious   Cardiovascular:     Rate and Rhythm: Normal rate.  Pulmonary:     Effort: Pulmonary effort is normal.  Skin:    General: Skin is warm and dry.  Neurological:     Mental Status: She is alert and oriented to person, place, and time.      UC Treatments / Results  Labs (all labs ordered are listed, but only abnormal results are displayed) Labs Reviewed  NOVEL CORONAVIRUS, NAA (HOSP ORDER, SEND-OUT TO REF LAB; TAT 18-24 HRS)    EKG   Radiology No results found.  Procedures Procedures (including critical care time)  Medications  Ordered in UC Medications - No data to display  Initial Impression / Assessment and Plan / UC Course  I have  reviewed the triage vital signs and the nursing notes.  Pertinent labs & imaging results that were available during my care of the patient were reviewed by me and considered in my medical decision making (see chart for details).     covid testing for screening collected and pending. Patient is asymptomatic. Her daughter was asymptomatic at their last exposure. Extensive discussion of covid signs symptoms and risks discussed. Patient verbalized understanding and agreeable to plan.   Final Clinical Impressions(s) / UC Diagnoses   Final diagnoses:  Exposure to COVID-19 virus  Encounter for laboratory testing for COVID-19 virus     Discharge Instructions     Self isolate until covid results are back and negative.  Will notify you by phone of any positive findings. Your negative results will be sent through your MyChart.       ED Prescriptions    None     PDMP not reviewed this encounter.   Zigmund Gottron, NP 04/21/19 1035

## 2019-04-21 NOTE — ED Provider Notes (Signed)
[facility] EMERGENCY DEPARTMENT Provider Note   CSN: [id number] Arrival date & time: 04/21/19  1606     History   Chief Complaint Chief Complaint  Patient presents with  . Chest Pain    HPI Maureen Bishop is a 68 y.o. female.     The history is provided by the patient.  Chest Pain Pain location:  Substernal area Pain quality: aching and tightness   Pain radiates to:  Does not radiate Pain severity:  Moderate Onset quality:  Gradual Duration:  3 days Timing:  Constant Progression:  Unchanged Chronicity:  New Context comment:  Started spontaneously and cannot recall a cause Relieved by:  None tried Exacerbated by: Laying down. Ineffective treatments:  None tried Associated symptoms: no abdominal pain, no anorexia, no cough, no fever, no nausea, no palpitations, no syncope and no vomiting   Associated symptoms comment:  Occasionally she feels like she needs to breathe more Risk factors: coronary artery disease, diabetes mellitus, hypertension and obesity   Risk factors comment:  Anemia   Past Medical History:  Diagnosis Date  . CAD (coronary artery disease)    a. moderate CAD with 70% prox LAD, 75% mLAD (not physiologically significant by FFR), 70% D1, 75% OM1 (relatively small vessels) - medical therapy recommended, reserving PCI for refractory symptoms.  . Diabetes mellitus type 2 in obese ([place])   . Former tobacco use   . Hyperlipidemia   . Hypertension   . Microcytic anemia    a. noted on labs 08/2018.  . Obesity     Patient Active Problem List   Diagnosis Date Noted  . Coronary artery disease, non-occlusive 09/13/2018  . Chest pain 09/05/2018  . Benign essential HTN 09/05/2018  . Type 2 diabetes mellitus without complication ([place]) [id number]  . Hyperlipidemia 09/05/2018    Past Surgical History:  Procedure Laterality Date  . CESAREAN SECTION    . INTRAVASCULAR PRESSURE WIRE/FFR STUDY N/A 09/06/2018   Procedure: INTRAVASCULAR  PRESSURE WIRE/FFR STUDY;  Surgeon: Lorretta Harp, MD;  Location: [facility];  Service: Cardiovascular;  Laterality: N/A;  . LEFT HEART CATH AND CORONARY ANGIOGRAPHY N/A 09/06/2018   Procedure: LEFT HEART CATH AND CORONARY ANGIOGRAPHY;  Surgeon: Lorretta Harp, MD;  Location: [facility];  Service: Cardiovascular;  Laterality: N/A;     OB History    Gravida  2   Para  2   Term      Preterm      AB      Living  2     SAB      TAB      Ectopic      Multiple      Live Births               Home Medications    Prior to Admission medications   Medication Sig Start Date End Date Taking? Authorizing Provider  acetaminophen (TYLENOL) 500 MG tablet Take 1,000 mg by mouth at bedtime.     [provider]  amLODipine (NORVASC) 2.5 MG tablet Take 1 tablet (2.5 mg total) by mouth at bedtime for 30 days. 09/13/18 10/13/18  Ledora Bottcher, PA  atorvastatin (LIPITOR) 80 MG tablet Take 1 tablet (80 mg total) by mouth daily at 6 (six) AM. 03/11/19   Dorothy Spark, MD  clopidogrel (PLAVIX) 75 MG tablet Take 75 mg by mouth daily.    [provider]  famotidine (PEPCID) 20 MG tablet Take 1  tablet (20 mg total) by mouth 2 (two) times daily. 04/21/19   Blanchie Dessert, MD  Iron, Ferrous Sulfate, 325 (65 Fe) MG TABS Take 325 mg by mouth 2 (two) times daily. Take for 2 months only. 02/08/19   Dorothy Spark, MD  metoprolol tartrate (LOPRESSOR) 25 MG tablet Take 1 tablet (25 mg total) by mouth 2 (two) times daily for 30 days. 09/13/18 10/13/18  Ledora Bottcher, PA  naproxen (NAPROSYN) 500 MG tablet Take 500 mg by mouth 2 (two) times daily as needed (for pain).    [provider]  nitroGLYCERIN (NITROSTAT) 0.4 MG SL tablet Place 1 tablet (0.4 mg total) under the tongue every 5 (five) minutes as needed for chest pain. 09/07/18   Amin, Jeanella Flattery, MD  pregabalin (LYRICA) 50 MG capsule Take 50-100 mg by mouth See admin instructions. Take  50 mg by mouth in the morning and 100 mg at bedtime    [provider]  SitaGLIPtin-MetFORMIN HCl (JANUMET XR) [phone] MG TB24 Take 1 tablet by mouth at bedtime.    [provider]    Family History Family History  Problem Relation Age of Onset  . CAD Father   . Heart attack Father        died at 13 y/o from MI     Social History Social History   Tobacco Use  . Smoking status: Former Research scientist (life sciences)  . Smokeless tobacco: Never Used  Substance Use Topics  . Alcohol use: Yes    Alcohol/week: 10.0 standard drinks    Types: 10 Glasses of wine per week  . Drug use: No     Allergies   Sulfa antibiotics and Sulfasalazine   Review of Systems Review of Systems  Constitutional: Negative for fever.  Respiratory: Negative for cough.   Cardiovascular: Positive for chest pain. Negative for palpitations and syncope.  Gastrointestinal: Negative for abdominal pain, anorexia, nausea and vomiting.  All other systems reviewed and are negative.    Physical Exam Updated Vital Signs BP (!) 160/63   Pulse (!) 55   Temp 98.2 F (36.8 C) (Oral)   Resp 16   Ht 4\' 11"  (1.499 m)   Wt 73.9 kg   SpO2 97%   BMI 32.92 kg/m   Physical Exam Vitals signs and nursing note reviewed.  Constitutional:      General: She is not in acute distress.    Appearance: She is well-developed.  HENT:     Head: Normocephalic and atraumatic.  Eyes:     Conjunctiva/sclera: Conjunctivae normal.     Pupils: Pupils are equal, round, and reactive to light.  Neck:     Musculoskeletal: Normal range of motion and neck supple.  Cardiovascular:     Rate and Rhythm: Normal rate and regular rhythm.     Pulses: Normal pulses.     Heart sounds: No murmur.  Pulmonary:     Effort: Pulmonary effort is normal. No respiratory distress.     Breath sounds: Normal breath sounds. No wheezing or rales.  Abdominal:     General: There is no distension.     Palpations: Abdomen is soft.     Tenderness: There is  no abdominal tenderness. There is no guarding or rebound.  Musculoskeletal: Normal range of motion.        General: No tenderness.     Right lower leg: No edema.     Left lower leg: No edema.  Skin:    General: Skin is warm and dry.  Capillary Refill: Capillary refill takes less than 2 seconds.     Findings: No erythema or rash.  Neurological:     Mental Status: She is alert and oriented to person, place, and time.  Psychiatric:        Behavior: Behavior normal.      ED Treatments / Results  Labs (all labs ordered are listed, but only abnormal results are displayed) Labs Reviewed  BASIC METABOLIC PANEL - Abnormal; Notable for the following components:      Result Value   Glucose, Bld 107 (*)    BUN 7 (*)    All other components within normal limits  CBC - Abnormal; Notable for the following components:   Hemoglobin 10.5 (*)    HCT 34.6 (*)    MCV 74.4 (*)    MCH 22.6 (*)    RDW 16.7 (*)    All other components within normal limits  TROPONIN I (HIGH SENSITIVITY)  TROPONIN I (HIGH SENSITIVITY)    EKG EKG Interpretation  Date/Time:  Sunday April 21 2019 16:32:25 EST Ventricular Rate:  58 PR Interval:  192 QRS Duration: 66 QT Interval:  388 QTC Calculation: 380 R Axis:   90 Text Interpretation: Sinus bradycardia Rightward axis Low voltage QRS No significant change since last tracing Confirmed by Blanchie Dessert [id number]) on 04/21/2019 6:07:46 PM   Radiology Dg Chest Portable 1 View  Result Date: 04/21/2019 CLINICAL DATA:  Pain. EXAM: PORTABLE CHEST 1 VIEW COMPARISON:  January 12, 2013 FINDINGS: The heart size and mediastinal contours are within normal limits. Both lungs are clear. The visualized skeletal structures are unremarkable. IMPRESSION: No active disease. Electronically Signed   By: Dorise Bullion III M.D   On: 04/21/2019 17:17    Procedures Procedures (including critical care time)  Medications Ordered in ED Medications  sodium chloride flush  (NS) 0.9 % injection 3 mL (has no administration in time range)  acetaminophen (TYLENOL) tablet 1,000 mg (has no administration in time range)  alum & mag hydroxide-simeth (MAALOX/MYLANTA) 200-200-20 MG/5ML suspension 30 mL (30 mLs Oral Given 04/21/19 1854)    And  lidocaine (XYLOCAINE) 2 % viscous mouth solution 15 mL (15 mLs Oral Given 04/21/19 1853)     Initial Impression / Assessment and Plan / ED Course  I have reviewed the triage vital signs and the nursing notes.  Pertinent labs & imaging results that were available during my care of the patient were reviewed by me and considered in my medical decision making (see chart for details).        Patient is a 68 year old female with a history of heart disease, diabetes and hypertension and anemia who is presenting today with atypical chest pain.  She describes as a pressure throughout the entire upper portion of her chest that is been there constantly for the last 3 days.  She does not have constant shortness of breath but does feel like she needs to take extra breaths at times.  She does have a known positive Covid contact and has a Covid swab that is still pending.  She denies any cough, fever.  She has no evidence of fluid overload today, lung sounds are clear and no concerning heart sounds.  Patient does not have any reproducible abdominal pain and low suspicion for gallbladder pathology, pancreatitis or diverticulitis.  Low suspicion for PE at this time.  Chest x-ray, EKG and troponin are all within normal limits.  Troponin was less than 2 and pain is been constant for 3  days do not feel that she needs a repeat.  Patient does take a ibuprofen, Advil and naproxen at home and feel that patient symptoms may be related to reflux with esophagitis.  Especially because the pain is worse when she lays down.  After GI cocktail patient symptoms are improved.  Findings discussed with the patient and will start her on Pepcid for the next 2 weeks also  recommended she stop taking all NSAIDs and if symptoms do not improve following up with her doctor within 1 to 2 weeks.  Final Clinical Impressions(s) / ED Diagnoses   Final diagnoses:  Atypical chest pain  Gastroesophageal reflux disease without esophagitis    ED Discharge Orders         Ordered    famotidine (PEPCID) 20 MG tablet  2 times daily     04/21/19 1951           Blanchie Dessert, MD 04/21/19 1956

## 2019-04-22 LAB — NOVEL CORONAVIRUS, NAA (HOSP ORDER, SEND-OUT TO REF LAB; TAT 18-24 HRS): SARS-CoV-2, NAA: NOT DETECTED

## 2019-05-06 DIAGNOSIS — Z03818 Encounter for observation for suspected exposure to other biological agents ruled out: Secondary | ICD-10-CM | POA: Diagnosis not present

## 2019-05-30 DIAGNOSIS — Z113 Encounter for screening for infections with a predominantly sexual mode of transmission: Secondary | ICD-10-CM | POA: Diagnosis not present

## 2019-05-30 DIAGNOSIS — Z6832 Body mass index (BMI) 32.0-32.9, adult: Secondary | ICD-10-CM | POA: Diagnosis not present

## 2019-05-30 DIAGNOSIS — N898 Other specified noninflammatory disorders of vagina: Secondary | ICD-10-CM | POA: Diagnosis not present

## 2019-05-30 DIAGNOSIS — Z01419 Encounter for gynecological examination (general) (routine) without abnormal findings: Secondary | ICD-10-CM | POA: Diagnosis not present

## 2019-06-03 DIAGNOSIS — I10 Essential (primary) hypertension: Secondary | ICD-10-CM | POA: Diagnosis not present

## 2019-06-03 DIAGNOSIS — Z6830 Body mass index (BMI) 30.0-30.9, adult: Secondary | ICD-10-CM | POA: Diagnosis not present

## 2019-06-03 DIAGNOSIS — E1169 Type 2 diabetes mellitus with other specified complication: Secondary | ICD-10-CM | POA: Diagnosis not present

## 2019-06-03 DIAGNOSIS — F064 Anxiety disorder due to known physiological condition: Secondary | ICD-10-CM | POA: Diagnosis not present

## 2019-06-03 DIAGNOSIS — I1310 Hypertensive heart and chronic kidney disease without heart failure, with stage 1 through stage 4 chronic kidney disease, or unspecified chronic kidney disease: Secondary | ICD-10-CM | POA: Diagnosis not present

## 2019-06-03 DIAGNOSIS — E782 Mixed hyperlipidemia: Secondary | ICD-10-CM | POA: Diagnosis not present

## 2019-06-03 DIAGNOSIS — R799 Abnormal finding of blood chemistry, unspecified: Secondary | ICD-10-CM | POA: Diagnosis not present

## 2019-06-03 DIAGNOSIS — F1290 Cannabis use, unspecified, uncomplicated: Secondary | ICD-10-CM | POA: Diagnosis not present

## 2019-06-03 DIAGNOSIS — T5193XS Toxic effect of unspecified alcohol, assault, sequela: Secondary | ICD-10-CM | POA: Diagnosis not present

## 2019-06-10 DIAGNOSIS — N3021 Other chronic cystitis with hematuria: Secondary | ICD-10-CM | POA: Diagnosis not present

## 2019-06-10 DIAGNOSIS — N952 Postmenopausal atrophic vaginitis: Secondary | ICD-10-CM | POA: Diagnosis not present

## 2019-07-02 ENCOUNTER — Encounter (HOSPITAL_COMMUNITY): Payer: Self-pay

## 2019-07-02 ENCOUNTER — Ambulatory Visit (HOSPITAL_COMMUNITY)
Admission: EM | Admit: 2019-07-02 | Discharge: 2019-07-02 | Disposition: A | Discharge status: Home / Self Care | Payer: Medicare HMO | Attending: Family Medicine | Admitting: Family Medicine

## 2019-07-02 ENCOUNTER — Other Ambulatory Visit: Payer: Self-pay

## 2019-07-02 DIAGNOSIS — Z20822 Contact with and (suspected) exposure to covid-19: Secondary | ICD-10-CM | POA: Insufficient documentation

## 2019-07-02 DIAGNOSIS — I10 Essential (primary) hypertension: Secondary | ICD-10-CM | POA: Insufficient documentation

## 2019-07-02 DIAGNOSIS — R079 Chest pain, unspecified: Secondary | ICD-10-CM | POA: Diagnosis not present

## 2019-07-02 NOTE — Discharge Instructions (Addendum)
You have been tested for COVID-19 today. If your test returns positive, you will receive a phone call from [facility] regarding your results. Negative test results are not called. Both positive and negative results area always visible on MyChart. If you do not have a MyChart account, sign up instructions are provided in your discharge papers. Please do not hesitate to contact us should you have questions or concerns.  You have been seen at the [facility] today for chest pain. Your evaluation today was not suggestive of any emergent condition requiring medical intervention at this time. Your ECG (heart tracing) did not show any worrisome changes. However, some medical problems make take more time to appear. Therefore, it's very important that you pay attention to any new symptoms or worsening of your current condition.  Please proceed directly to the Emergency Department immediately should you feel worse in any way or have any of the following symptoms: increasing or different chest pain, pain that spreads to your arm, neck, jaw, back or abdomen, shortness of breath, or nausea and vomiting.  Your blood pressure was noted to be slightly elevated during your visit today. You may return here within the next few days to recheck if unable to see your primary care doctor.   BP (!) 146/68 (BP Location: Left Arm)   Pulse 84   Temp 98.5 F (36.9 C) (Oral)   Resp 17   SpO2 100%

## 2019-07-02 NOTE — ED Triage Notes (Signed)
Patient presents to Urgent Care with complaints of centralized cp since 4 days ago. Patient reports she is worried she may have covid, pt has hx of htn.

## 2019-07-03 NOTE — ED Provider Notes (Signed)
Maureen Bishop   [id number] 07/02/19 Arrival Time: 1632  ASSESSMENT & PLAN:  1. Contact with and (suspected) exposure to covid-19   2. Chest pain, unspecified type   3. Elevated blood pressure reading in office with diagnosis of hypertension     ECG: NSR; no STEMI.   Discharge Instructions     You have been tested for COVID-19 today. If your test returns positive, you will receive a phone call from [facility] regarding your results. Negative test results are not called. Both positive and negative results area always visible on MyChart. If you do not have a MyChart account, sign up instructions are provided in your discharge papers. Please do not hesitate to contact us should you have questions or concerns.  You have been seen at the [facility] today for chest pain. Your evaluation today was not suggestive of any emergent condition requiring medical intervention at this time. Your ECG (heart tracing) did not show any worrisome changes. However, some medical problems make take more time to appear. Therefore, it's very important that you pay attention to any new symptoms or worsening of your current condition.  Please proceed directly to the Emergency Department immediately should you feel worse in any way or have any of the following symptoms: increasing or different chest pain, pain that spreads to your arm, neck, jaw, back or abdomen, shortness of breath, or nausea and vomiting.  Your blood pressure was noted to be slightly elevated during your visit today. You may return here within the next few days to recheck if unable to see your primary care doctor.   BP (!) 146/68 (BP Location: Left Arm)   Pulse 84   Temp 98.5 F (36.9 C) (Oral)   Resp 17   SpO2 100%       COVID testing sent.   Reviewed expectations re: course of current medical issues. Questions answered. Outlined signs and symptoms indicating need for more acute intervention. Patient  verbalized understanding. After Visit Summary given.   SUBJECTIVE:  History from: patient. Maureen Bishop is a 69 y.o. female who requests COVID testing. Reports possible exposure. No cough or fever. Does reports occasional chest "heavy feeling". Onset gradual, first noted 3-4 days ago. Reports these symptoms are stable since beginning. No trigger identified; random. Without associated n/v/diaphoresis/SOB. Injury or recent strenuous activity: none. Typical duration of symptoms when present: minutes. Denies: irregular heart beat, lower extremity edema, near-syncope, orthopnea, palpitations, paroxysmal nocturnal dyspnea and syncope. Aggravating factors: have not been identified. Alleviating factors: have not been identified. Ambulatory without assistance. Self/OTC treatment: none. Illicit drug use: none.  Social History   Tobacco Use  Smoking Status Former Smoker  Smokeless Tobacco Never Used   Social History   Substance and Sexual Activity  Alcohol Use Yes  . Alcohol/week: 10.0 standard drinks  . Types: 10 Glasses of wine per week    Increased blood pressure noted today. Reports that she is treated for HTN.  She reports taking medications as instructed, no swelling of ankles, no orthostatic dizziness or lightheadedness, no orthopnea or paroxysmal nocturnal dyspnea and no palpitations.    OBJECTIVE:  Vitals:   07/02/19 1742  BP: (!) 146/68  Pulse: 84  Resp: 17  Temp: 98.5 F (36.9 C)  TempSrc: Oral  SpO2: 100%    General appearance: alert, oriented, no acute distress Eyes: PERRLA; EOMI; conjunctivae normal HENT: normocephalic; atraumatic Neck: supple with FROM Lungs: without labored respirations; speaks full sentences without difficulty; CTAB Heart: regular  rate and rhythm without murmer Chest Wall: without tenderness to palpation Abdomen: soft, non-tender; no guarding or rebound tenderness Extremities: without edema; without calf swelling or tenderness;  symmetrical without gross deformities Skin: warm and dry; without rash or lesions Neuro: normal gait Psychological: alert and cooperative; normal mood and affect  Labs: Results for orders placed or performed during the hospital encounter of [id number]  Basic metabolic panel  Result Value Ref Range   Sodium 142 135 - 145 mmol/L   Potassium 4.0 3.5 - 5.1 mmol/L   Chloride 108 98 - 111 mmol/L   CO2 27 22 - 32 mmol/L   Glucose, Bld 107 (H) 70 - 99 mg/dL   BUN 7 (L) 8 - 23 mg/dL   Creatinine, Ser 0.87 0.44 - 1.00 mg/dL   Calcium 9.3 8.9 - 10.3 mg/dL   GFR calc non Af Amer >60 >60 mL/min   GFR calc Af Amer >60 >60 mL/min   Anion gap 7 5 - 15  CBC  Result Value Ref Range   WBC 8.3 4.0 - 10.5 K/uL   RBC 4.65 3.87 - 5.11 MIL/uL   Hemoglobin 10.5 (L) 12.0 - 15.0 g/dL   HCT 34.6 (L) 36.0 - 46.0 %   MCV 74.4 (L) 80.0 - 100.0 fL   MCH 22.6 (L) 26.0 - 34.0 pg   MCHC 30.3 30.0 - 36.0 g/dL   RDW 16.7 (H) 11.5 - 15.5 %   Platelets 289 150 - 400 K/uL   nRBC 0.0 0.0 - 0.2 %  Troponin I (High Sensitivity)  Result Value Ref Range   Troponin I (High Sensitivity) <2 <18 ng/L   Labs Reviewed  NOVEL CORONAVIRUS, NAA (HOSP ORDER, SEND-OUT TO REF LAB; TAT 18-24 HRS)    Imaging: No results found.   Allergies  Allergen Reactions  . Sulfa Antibiotics Hives  . Sulfasalazine Hives    Past Medical History:  Diagnosis Date  . CAD (coronary artery disease)    a. moderate CAD with 70% prox LAD, 75% mLAD (not physiologically significant by FFR), 70% D1, 75% OM1 (relatively small vessels) - medical therapy recommended, reserving PCI for refractory symptoms.  . Diabetes mellitus type 2 in obese ([place])   . Former tobacco use   . Hyperlipidemia   . Hypertension   . Microcytic anemia    a. noted on labs 08/2018.  . Obesity    Social History   Socioeconomic History  . Marital status: Widowed    Spouse name: Not on file  . Number of children: Not on file  . Years of education: Not on file  .  Highest education level: Not on file  Occupational History  . Not on file  Tobacco Use  . Smoking status: Former Research scientist (life sciences)  . Smokeless tobacco: Never Used  Substance and Sexual Activity  . Alcohol use: Yes    Alcohol/week: 10.0 standard drinks    Types: 10 Glasses of wine per week  . Drug use: No  . Sexual activity: Yes    Birth control/protection: Post-menopausal  Other Topics Concern  . Not on file  Social History Narrative  . Not on file   Social Determinants of Health   Financial Resource Strain:   . Difficulty of Paying Living Expenses: Not on file  Food Insecurity:   . Worried About Charity fundraiser in the Last Year: Not on file  . Ran Out of Food in the Last Year: Not on file  Transportation Needs:   . Lack of Transportation (  Medical): Not on file  . Lack of Transportation (Non-Medical): Not on file  Physical Activity:   . Days of Exercise per Week: Not on file  . Minutes of Exercise per Session: Not on file  Stress:   . Feeling of Stress : Not on file  Social Connections:   . Frequency of Communication with Friends and Family: Not on file  . Frequency of Social Gatherings with Friends and Family: Not on file  . Attends Religious Services: Not on file  . Active Member of Clubs or Organizations: Not on file  . Attends Archivist Meetings: Not on file  . Marital Status: Not on file  Intimate Partner Violence:   . Fear of Current or Ex-Partner: Not on file  . Emotionally Abused: Not on file  . Physically Abused: Not on file  . Sexually Abused: Not on file   Family History  Problem Relation Age of Onset  . CAD Father   . Heart attack Father        died at 74 y/o from MI   . Diabetes Mother    Past Surgical History:  Procedure Laterality Date  . CESAREAN SECTION    . INTRAVASCULAR PRESSURE WIRE/FFR STUDY N/A 09/06/2018   Procedure: INTRAVASCULAR PRESSURE WIRE/FFR STUDY;  Surgeon: Lorretta Harp, MD;  Location: [place] [facility];  Service:  Cardiovascular;  Laterality: N/A;  . LEFT HEART CATH AND CORONARY ANGIOGRAPHY N/A 09/06/2018   Procedure: LEFT HEART CATH AND CORONARY ANGIOGRAPHY;  Surgeon: Lorretta Harp, MD;  Location: [place];  Service: Cardiovascular;  Laterality: N/A;     Vanessa Kick, MD 07/03/19 [phone]

## 2019-07-04 LAB — NOVEL CORONAVIRUS, NAA (HOSP ORDER, SEND-OUT TO REF LAB; TAT 18-24 HRS): SARS-CoV-2, NAA: NOT DETECTED

## 2019-08-13 DIAGNOSIS — I10 Essential (primary) hypertension: Secondary | ICD-10-CM | POA: Diagnosis not present

## 2019-09-26 ENCOUNTER — Ambulatory Visit: Payer: Medicare HMO | Admitting: Neurology

## 2019-09-26 DIAGNOSIS — H25011 Cortical age-related cataract, right eye: Secondary | ICD-10-CM | POA: Diagnosis not present

## 2019-09-26 DIAGNOSIS — Z961 Presence of intraocular lens: Secondary | ICD-10-CM | POA: Diagnosis not present

## 2019-09-26 DIAGNOSIS — H43812 Vitreous degeneration, left eye: Secondary | ICD-10-CM | POA: Diagnosis not present

## 2019-09-26 DIAGNOSIS — E119 Type 2 diabetes mellitus without complications: Secondary | ICD-10-CM | POA: Diagnosis not present

## 2019-09-27 ENCOUNTER — Other Ambulatory Visit: Payer: Self-pay

## 2019-09-27 ENCOUNTER — Ambulatory Visit: Payer: Medicare HMO | Admitting: Cardiology

## 2019-09-27 ENCOUNTER — Encounter: Payer: Self-pay | Admitting: Cardiology

## 2019-09-27 VITALS — BP 132/78 | HR 57 | Ht 59.0 in | Wt 169.4 lb

## 2019-09-27 DIAGNOSIS — E782 Mixed hyperlipidemia: Secondary | ICD-10-CM

## 2019-09-27 DIAGNOSIS — I2510 Atherosclerotic heart disease of native coronary artery without angina pectoris: Secondary | ICD-10-CM

## 2019-09-27 DIAGNOSIS — I10 Essential (primary) hypertension: Secondary | ICD-10-CM

## 2019-09-27 NOTE — Patient Instructions (Addendum)
Medication Instructions:  Your physician recommends that you continue on your current medications as directed. Please refer to the Current Medication list given to you today.  *If you need a refill on your cardiac medications before your next appointment, please call your pharmacy*   Lab Work: Fasting lab work prior to 6 month follow-up. If you have labs (blood work) drawn today and your tests are completely normal, you will receive your results only by: Marland Kitchen MyChart Message (if you have MyChart) OR . A paper copy in the mail If you have any lab test that is abnormal or we need to change your treatment, we will call you to review the results.    Follow-Up: At [facility], you and your health needs are our priority.  As part of our continuing mission to provide you with exceptional heart care, we have created designated Provider Care Teams.  These Care Teams include your primary Cardiologist (physician) and Advanced Practice Providers (APPs -  Physician Assistants and Nurse Practitioners) who all work together to provide you with the care you need, when you need it.  Your next appointment:   6 month(s)  The format for your next appointment:   In Person  Provider:   You may see Ena Dawley, MD or one of the following Advanced Practice Providers on your designated Care Team:    Melina Copa, PA-C  Ermalinda Barrios, PA-C

## 2019-09-27 NOTE — Progress Notes (Signed)
Cardiology Office Note:    Date:  09/27/2019   ID:  Eustaquio Maize, DOB 1950-12-04, MRN [id number]  PCP:  Lucianne Lei, MD  Cardiologist:  Ena Dawley, MD  Electrophysiologist:  None   Referring MD: Lucianne Lei, MD   Reason for visit: 6 months follow-up  History of Present Illness:    Maureen Bishop is a 69 y.o. female with a hx of HTN, DM, HLD, former smoker, and recently diagnosed moderate CAD and microcytic anemia. She was admitted in 08/2018 with intermittent chest pain and ruled out for MI. Chest CT was negative for PE with mild aortic calcification noted. CXR was unremarkable. She underwent cardiac cath showing moderate CAD with 70% prox LAD, 75% mLAD (not physiologically significant by FFR), 70% D1, 75% OM1 (relatively small vessels) - medical therapy recommended. It ws recommended to reserve PCI for recalcitrant symptoms. She was started on Plavix given ASA allergy although it was on her DC list. She was also started on atorvastatin and metoprolol. Labs showed A1C 6.1, LDL 131, Hgb 9.9 (microcytic - previously 12 range in 2014), K 3.9, Cr 0.82, LFTs wnl.  09/13/2018 - Given her microcytic anemia and nonobstructive CAD, plavix was discontinued.  09/27/2019 -after 6 months, she has been doing well, she walks several times a week for several miles and only gets chest tightness on moderate to severe exertion when she is walking uphill.  Overall her symptoms improved.  She has never used sublingual nitroglycerin.  She is very motivated to exercise.  She denies any lower extremity edema orthopnea proximal nocturnal dyspnea.  She has been compliant with her medications.  She has no side effects.   Past Medical History:  Diagnosis Date  . CAD (coronary artery disease)    a. moderate CAD with 70% prox LAD, 75% mLAD (not physiologically significant by FFR), 70% D1, 75% OM1 (relatively small vessels) - medical therapy recommended, reserving PCI for refractory symptoms.  . Diabetes mellitus  type 2 in obese ([place])   . Former tobacco use   . Hyperlipidemia   . Hypertension   . Microcytic anemia    a. noted on labs 08/2018.  . Obesity     Past Surgical History:  Procedure Laterality Date  . CESAREAN SECTION    . INTRAVASCULAR PRESSURE WIRE/FFR STUDY N/A 09/06/2018   Procedure: INTRAVASCULAR PRESSURE WIRE/FFR STUDY;  Surgeon: Lorretta Harp, MD;  Location: [facility];  Service: Cardiovascular;  Laterality: N/A;  . LEFT HEART CATH AND CORONARY ANGIOGRAPHY N/A 09/06/2018   Procedure: LEFT HEART CATH AND CORONARY ANGIOGRAPHY;  Surgeon: Lorretta Harp, MD;  Location: [facility];  Service: Cardiovascular;  Laterality: N/A;    Current Medications: Current Meds  Medication Sig  . acetaminophen (TYLENOL) 500 MG tablet Take 1,000 mg by mouth at bedtime.   Marland Kitchen atorvastatin (LIPITOR) 80 MG tablet Take 1 tablet (80 mg total) by mouth daily at 6 (six) AM.  . clopidogrel (PLAVIX) 75 MG tablet Take 75 mg by mouth daily.  . famotidine (PEPCID) 20 MG tablet Take 1 tablet (20 mg total) by mouth 2 (two) times daily.  . Iron, Ferrous Sulfate, 325 (65 Fe) MG TABS Take 325 mg by mouth 2 (two) times daily. Take for 2 months only.  . naproxen (NAPROSYN) 500 MG tablet Take 500 mg by mouth 2 (two) times daily as needed (for pain).  . nitroGLYCERIN (NITROSTAT) 0.4 MG SL tablet Place 1 tablet (0.4 mg total) under the tongue every 5 (five)  minutes as needed for chest pain.  . pregabalin (LYRICA) 50 MG capsule Take 50-100 mg by mouth See admin instructions. Take 50 mg by mouth in the morning and 100 mg at bedtime  . SitaGLIPtin-MetFORMIN HCl (JANUMET XR) [id number] MG TB24 Take 1 tablet by mouth at bedtime.     Allergies:   Sulfa antibiotics and Sulfasalazine   Social History   Socioeconomic History  . Marital status: Widowed    Spouse name: Not on file  . Number of children: Not on file  . Years of education: Not on file  . Highest education level: Not on file  Occupational  History  . Not on file  Tobacco Use  . Smoking status: Former Research scientist (life sciences)  . Smokeless tobacco: Never Used  Substance and Sexual Activity  . Alcohol use: Yes    Alcohol/week: 10.0 standard drinks    Types: 10 Glasses of wine per week  . Drug use: No  . Sexual activity: Yes    Birth control/protection: Post-menopausal  Other Topics Concern  . Not on file  Social History Narrative  . Not on file   Social Determinants of Health   Financial Resource Strain:   . Difficulty of Paying Living Expenses:   Food Insecurity:   . Worried About Charity fundraiser in the Last Year:   . Arboriculturist in the Last Year:   Transportation Needs:   . Film/video editor (Medical):   Marland Kitchen Lack of Transportation (Non-Medical):   Physical Activity:   . Days of Exercise per Week:   . Minutes of Exercise per Session:   Stress:   . Feeling of Stress :   Social Connections:   . Frequency of Communication with Friends and Family:   . Frequency of Social Gatherings with Friends and Family:   . Attends Religious Services:   . Active Member of Clubs or Organizations:   . Attends Archivist Meetings:   Marland Kitchen Marital Status:     Family History: The patient's family history includes CAD in her father; Diabetes in her mother; Heart attack in her father.  ROS:   Please see the history of present illness.    All other systems reviewed and are negative.  EKGs/Labs/Other Studies Reviewed:    The following studies were reviewed today:  EKG:  EKG is ordered today.  The ekg ordered today demonstrates bradycardia, 59 bpm, normal EKG, unchanged from prior.  Recent Labs: 10/25/2018: ALT 28 04/21/2019: BUN 7; Creatinine, Ser 0.87; Hemoglobin 10.5; Platelets 289; Potassium 4.0; Sodium 142  Recent Lipid Panel    Component Value Date/Time   CHOL 137 10/25/2018 1338   TRIG 71 10/25/2018 1338   HDL 44 10/25/2018 1338   CHOLHDL 3.1 10/25/2018 1338   CHOLHDL 4.2 09/06/2018 0236   VLDL 17 09/06/2018 0236     LDLCALC 79 10/25/2018 1338    Physical Exam:    VS:  BP 132/78   Pulse (!) 57   Ht 4\' 11"  (1.499 m)   Wt 169 lb 6.4 oz (76.8 kg)   SpO2 96%   BMI 34.21 kg/m     Wt Readings from Last 3 Encounters:  09/27/19 169 lb 6.4 oz (76.8 kg)  04/21/19 163 lb (73.9 kg)  02/08/19 164 lb (74.4 kg)     GEN:  Well nourished, well developed in no acute distress HEENT: Normal NECK: No JVD; No carotid bruits LYMPHATICS: No lymphadenopathy CARDIAC: RRR, no murmurs, rubs, gallops RESPIRATORY:  Clear to auscultation  without rales, wheezing or rhonchi  ABDOMEN: Soft, non-tender, non-distended MUSCULOSKELETAL:  No edema; No deformity  SKIN: Warm and dry NEUROLOGIC:  Alert and oriented x 3 PSYCHIATRIC:  Normal affect    ASSESSMENT:    1. Benign essential HTN   2. Mixed hyperlipidemia   3. CAD in native artery     PLAN:    In order of problems listed above:  1. CAD Asymptomatic, on Plavix only. Advised to walk 3-5x/week.  2. Hyperlipidemia We will continue current regimen with high-dose atorvastatin, lipids are at goal, will recheck prior to the next visit.  3. Hypertension Well-controlled.  4. Microcytic anemia Hemoglobin at discharge was 9.9 (10.0), microcytic, she had GI eval in July, we will prescribe FeS 325 mg PO BID for 2 month, she has frequent bleeding from hemorrhoids.   Medication Adjustments/Labs and Tests Ordered: Current medicines are reviewed at length with the patient today.  Concerns regarding medicines are outlined above.  Orders Placed This Encounter  Procedures  . CBC  . Comprehensive metabolic panel  . TSH  . Lipid panel  . EKG 12-Lead   No orders of the defined types were placed in this encounter.  Patient Instructions  Medication Instructions:  Your physician recommends that you continue on your current medications as directed. Please refer to the Current Medication list given to you today.  *If you need a refill on your cardiac medications before  your next appointment, please call your pharmacy*   Lab Work: Fasting lab work prior to 6 month follow-up. If you have labs (blood work) drawn today and your tests are completely normal, you will receive your results only by: Marland Kitchen MyChart Message (if you have MyChart) OR . A paper copy in the mail If you have any lab test that is abnormal or we need to change your treatment, we will call you to review the results.    Follow-Up: At [facility], you and your health needs are our priority.  As part of our continuing mission to provide you with exceptional heart care, we have created designated Provider Care Teams.  These Care Teams include your primary Cardiologist (physician) and Advanced Practice Providers (APPs -  Physician Assistants and Nurse Practitioners) who all work together to provide you with the care you need, when you need it.  Your next appointment:   6 month(s)  The format for your next appointment:   In Person  Provider:   You may see Ena Dawley, MD or one of the following Advanced Practice Providers on your designated Care Team:    Melina Copa, PA-C  Ermalinda Barrios, PA-C       Signed, Ena Dawley, MD  09/27/2019 10:10 AM    [place]

## 2019-11-07 ENCOUNTER — Ambulatory Visit: Payer: Medicare HMO | Admitting: Neurology

## 2019-11-07 ENCOUNTER — Other Ambulatory Visit: Payer: Self-pay

## 2019-11-07 ENCOUNTER — Encounter: Payer: Self-pay | Admitting: Neurology

## 2019-11-07 VITALS — BP 158/77 | HR 58 | Ht 60.0 in | Wt 168.0 lb

## 2019-11-07 DIAGNOSIS — R799 Abnormal finding of blood chemistry, unspecified: Secondary | ICD-10-CM | POA: Diagnosis not present

## 2019-11-07 DIAGNOSIS — R259 Unspecified abnormal involuntary movements: Secondary | ICD-10-CM | POA: Insufficient documentation

## 2019-11-07 DIAGNOSIS — E559 Vitamin D deficiency, unspecified: Secondary | ICD-10-CM | POA: Diagnosis not present

## 2019-11-07 DIAGNOSIS — R292 Abnormal reflex: Secondary | ICD-10-CM | POA: Diagnosis not present

## 2019-11-07 DIAGNOSIS — R7989 Other specified abnormal findings of blood chemistry: Secondary | ICD-10-CM | POA: Diagnosis not present

## 2019-11-07 DIAGNOSIS — E538 Deficiency of other specified B group vitamins: Secondary | ICD-10-CM | POA: Diagnosis not present

## 2019-11-07 NOTE — Progress Notes (Signed)
PATIENT: Maureen Bishop DOB: 02/19/1951  Chief Complaint  Patient presents with   Jerking Movements    Pt sts she is here to discuss intermintenet torso jerks. She sts these sx started a couple of months ago and feels like sx are worse when she is stressed    PCP    Lucianne Lei, MD     HISTORICAL  Maureen Bishop is a 69 year old female, seen in request by her primary care physician Lucianne Lei, MD for evaluation of intermittent jerking movement, initial evaluation was on November 07, 2019.  I reviewed and summarized the referring note. She has past medical history of type 2 diabetes, hypertension, hyperlipidemia, known history of microcytic anemia, obesity, coronary artery disease,  She reported 6 months history of intermittent sudden onset uncontrollable body jerking movement, started since January 2021, she described sudden onset transient body jerking movement, it can involve her limbs or whole body, lasting for few seconds, she has no loss of consciousness, there was no sequelae aft the jerking movement, it happened sporadically, sometimes daily, sometimes few days without occurrence, she was not able to identify any triggers,  She still works as a Forensic psychologist, highly function, lives independently, did reported a history of excessive alcohol use in the past, reported abnormal liver functional test that is attributed to her excessive alcohol use, she is now conscious about it, but it also reported that during summertime, she often gathered with her friend around the pool, tends to over drink in a scenario like that  She was started on Lyrica 50/100 mg daily, which seems to help her symptoms  REVIEW OF SYSTEMS: Full 14 system review of systems performed and notable only for as above All other review of systems were negative.  ALLERGIES: Allergies  Allergen Reactions   Sulfa Antibiotics Hives   Sulfasalazine Hives    HOME MEDICATIONS: Current Outpatient Medications    Medication Sig Dispense Refill   acetaminophen (TYLENOL) 500 MG tablet Take 1,000 mg by mouth at bedtime.      atorvastatin (LIPITOR) 80 MG tablet Take 1 tablet (80 mg total) by mouth daily at 6 (six) AM. 90 tablet 3   clopidogrel (PLAVIX) 75 MG tablet Take 75 mg by mouth daily.     famotidine (PEPCID) 20 MG tablet Take 1 tablet (20 mg total) by mouth 2 (two) times daily. 28 tablet 0   Iron, Ferrous Sulfate, 325 (65 Fe) MG TABS Take 325 mg by mouth 2 (two) times daily. Take for 2 months only. 120 tablet 0   naproxen (NAPROSYN) 500 MG tablet Take 500 mg by mouth 2 (two) times daily as needed (for pain).     nitroGLYCERIN (NITROSTAT) 0.4 MG SL tablet Place 1 tablet (0.4 mg total) under the tongue every 5 (five) minutes as needed for chest pain. 30 tablet 0   pregabalin (LYRICA) 50 MG capsule Take 50-100 mg by mouth See admin instructions. Take 50 mg by mouth in the morning and 100 mg at bedtime     SitaGLIPtin-MetFORMIN HCl (JANUMET XR) [phone] MG TB24 Take 1 tablet by mouth at bedtime.     amLODipine (NORVASC) 2.5 MG tablet Take 1 tablet (2.5 mg total) by mouth at bedtime for 30 days. 90 tablet 1   metoprolol tartrate (LOPRESSOR) 25 MG tablet Take 1 tablet (25 mg total) by mouth 2 (two) times daily for 30 days. 180 tablet 1   No current facility-administered medications for this visit.    PAST MEDICAL  HISTORY: Past Medical History:  Diagnosis Date   CAD (coronary artery disease)    a. moderate CAD with 70% prox LAD, 75% mLAD (not physiologically significant by FFR), 70% D1, 75% OM1 (relatively small vessels) - medical therapy recommended, reserving PCI for refractory symptoms.   Diabetes mellitus type 2 in obese [facility])    Former tobacco use    Hyperlipidemia    Hypertension    Microcytic anemia    a. noted on labs 08/2018.   Obesity     PAST SURGICAL HISTORY: Past Surgical History:  Procedure Laterality Date   CESAREAN SECTION     INTRAVASCULAR PRESSURE WIRE/FFR  STUDY N/A 09/06/2018   Procedure: INTRAVASCULAR PRESSURE WIRE/FFR STUDY;  Surgeon: Lorretta Harp, MD;  Location: [place] [facility];  Service: Cardiovascular;  Laterality: N/A;   LEFT HEART CATH AND CORONARY ANGIOGRAPHY N/A 09/06/2018   Procedure: LEFT HEART CATH AND CORONARY ANGIOGRAPHY;  Surgeon: Lorretta Harp, MD;  Location: [place] [facility];  Service: Cardiovascular;  Laterality: N/A;    FAMILY HISTORY: Family History  Problem Relation Age of Onset   CAD Father    Heart attack Father        died at 43 y/o from MI    Diabetes Mother     SOCIAL HISTORY: Social History   Socioeconomic History   Marital status: Widowed    Spouse name: Not on file   Number of children: Not on file   Years of education: Not on file   Highest education level: Not on file  Occupational History   Not on file  Tobacco Use   Smoking status: Former Smoker   Smokeless tobacco: Never Used  Vaping Use   Vaping Use: Never used  Substance and Sexual Activity   Alcohol use: Yes    Alcohol/week: 10.0 standard drinks    Types: 10 Glasses of wine per week   Drug use: No   Sexual activity: Yes    Birth control/protection: Post-menopausal  Other Topics Concern   Not on file  Social History Narrative   Not on file   Social Determinants of Health   Financial Resource Strain:    Difficulty of Paying Living Expenses:   Food Insecurity:    Worried About Charity fundraiser in the Last Year:    Arboriculturist in the Last Year:   Transportation Needs:    Film/video editor (Medical):    Lack of Transportation (Non-Medical):   Physical Activity:    Days of Exercise per Week:    Minutes of Exercise per Session:   Stress:    Feeling of Stress :   Social Connections:    Frequency of Communication with Friends and Family:    Frequency of Social Gatherings with Friends and Family:    Attends Religious Services:    Active Member of Clubs or Organizations:     Attends Archivist Meetings:    Marital Status:   Intimate Partner Violence:    Fear of Current or Ex-Partner:    Emotionally Abused:    Physically Abused:    Sexually Abused:      PHYSICAL EXAM   Vitals:   11/07/19 1445  BP: (!) 158/77  Pulse: (!) 58  Weight: 168 lb (76.2 kg)  Height: 5' (1.524 m)   Not recorded     Body mass index is 32.81 kg/m.  PHYSICAL EXAMNIATION:  Gen: NAD, conversant, well nourised, well groomed  Cardiovascular: Regular rate rhythm, no peripheral edema, warm, nontender. Eyes: Conjunctivae clear without exudates or hemorrhage Neck: Supple, no carotid bruits. Pulmonary: Clear to auscultation bilaterally   NEUROLOGICAL EXAM:  MENTAL STATUS: Speech:    Speech is normal; fluent and spontaneous with normal comprehension.  Cognition:     Orientation to time, place and person     Normal recent and remote memory     Normal Attention span and concentration     Normal Language, naming, repeating,spontaneous speech     Fund of knowledge   CRANIAL NERVES: CN II: Visual fields are full to confrontation. Pupils are round equal and briskly reactive to light. CN III, IV, VI: extraocular movement are normal. No ptosis. CN V: Facial sensation is intact to light touch CN VII: Face is symmetric with normal eye closure  CN VIII: Hearing is normal to causal conversation. CN IX, X: Phonation is normal. CN XI: Head turning and shoulder shrug are intact  MOTOR: There is no pronator drift of out-stretched arms. Muscle bulk and tone are normal. Muscle strength is normal.  REFLEXES: Reflexes are 3 and symmetric at the biceps, triceps, knees, and trace at ankles. Plantar responses are extensor bilaterally  SENSORY: Intact to light touch, pinprick and vibratory sensation are intact in fingers and toes.  COORDINATION: There is no trunk or limb dysmetria noted.  GAIT/STANCE: She can get up from seated position arms crossed,  steady, able to perform tiptoe, heel walking  DIAGNOSTIC DATA (LABS, IMAGING, TESTING) - I reviewed patient records, labs, notes, testing and imaging myself where available.   ASSESSMENT AND PLAN  Maureen Bishop is a 69 y.o. female   History of alcohol abuse, reported abnormal liver functional test in the past, Recurrent jerking movement,  Likely myoclonic jerking,  Differentiation diagnosis including metabolic disarrangement, she was found to have hyperreflexia on examination,  MRI of the brain to rule out structural abnormality,  Laboratory evaluations to rule out treatable etiology  Orders Placed This Encounter  Procedures   MR BRAIN WO CONTRAST   Vitamin B12   RPR   HIV Antibody (routine testing w rflx)   Folate   C-reactive protein   TSH   CK   Sedimentation rate   VITAMIN D 25 Hydroxy (Vit-D Deficiency, Fractures)   CBC with Differential/Platelet   Comprehensive metabolic panel   Marcial Pacas, M.D. Ph.D.  [facility] [address], [place], [place] [address] Ph: [phone] Fax: [phone]  CC: Lucianne Lei, MD  Referring Physician

## 2019-11-08 LAB — COMPREHENSIVE METABOLIC PANEL
ALT: 13 IU/L (ref 0–32)
AST: 18 IU/L (ref 0–40)
Albumin/Globulin Ratio: 1.3 (ref 1.2–2.2)
Albumin: 4.3 g/dL (ref 3.7–4.7)
Alkaline Phosphatase: 78 IU/L (ref 48–121)
BUN/Creatinine Ratio: 16 (ref 12–28)
BUN: 14 mg/dL (ref 8–27)
Bilirubin Total: 0.2 mg/dL (ref 0.0–1.2)
CO2: 23 mmol/L (ref 20–29)
Calcium: 9.4 mg/dL (ref 8.7–10.3)
Chloride: 104 mmol/L (ref 96–106)
Creatinine, Ser: 0.85 mg/dL (ref 0.57–1.00)
GFR calc Af Amer: 80 mL/min/{1.73_m2} (ref >59)
GFR calc non Af Amer: 69 mL/min/{1.73_m2} (ref >59)
Globulin, Total: 3.2 g/dL (ref 1.5–4.5)
Glucose: 88 mg/dL (ref 65–99)
Potassium: 4.6 mmol/L (ref 3.5–5.2)
Sodium: 142 mmol/L (ref 134–144)
Total Protein: 7.5 g/dL (ref 6.0–8.5)

## 2019-11-08 LAB — CBC WITH DIFFERENTIAL/PLATELET
Basophils Absolute: 0.1 10*3/uL (ref 0.0–0.2)
Basos: 1 %
EOS (ABSOLUTE): 0.1 10*3/uL (ref 0.0–0.4)
Eos: 2 %
Hematocrit: 39.3 % (ref 34.0–46.6)
Hemoglobin: 11.8 g/dL (ref 11.1–15.9)
Immature Grans (Abs): 0 10*3/uL (ref 0.0–0.1)
Immature Granulocytes: 0 %
Lymphocytes Absolute: 2.4 10*3/uL (ref 0.7–3.1)
Lymphs: 28 %
MCH: 22.3 pg — ABNORMAL LOW (ref 26.6–33.0)
MCHC: 30 g/dL — ABNORMAL LOW (ref 31.5–35.7)
MCV: 74 fL — ABNORMAL LOW (ref 79–97)
Monocytes Absolute: 0.6 10*3/uL (ref 0.1–0.9)
Monocytes: 7 %
Neutrophils Absolute: 5.5 10*3/uL (ref 1.4–7.0)
Neutrophils: 62 %
Platelets: 303 10*3/uL (ref 150–450)
RBC: 5.28 x10E6/uL (ref 3.77–5.28)
RDW: 15.7 % — ABNORMAL HIGH (ref 11.7–15.4)
WBC: 8.7 10*3/uL (ref 3.4–10.8)

## 2019-11-08 LAB — VITAMIN D 25 HYDROXY (VIT D DEFICIENCY, FRACTURES): Vit D, 25-Hydroxy: 18.4 ng/mL — ABNORMAL LOW (ref 30.0–100.0)

## 2019-11-08 LAB — RPR: RPR Ser Ql: NONREACTIVE

## 2019-11-08 LAB — C-REACTIVE PROTEIN: CRP: 13 mg/L — ABNORMAL HIGH (ref 0–10)

## 2019-11-08 LAB — FOLATE: Folate: 14.8 ng/mL (ref >3.0)

## 2019-11-08 LAB — HIV ANTIBODY (ROUTINE TESTING W REFLEX): HIV Screen 4th Generation wRfx: NONREACTIVE

## 2019-11-08 LAB — CK: Total CK: 57 U/L (ref 32–182)

## 2019-11-08 LAB — TSH: TSH: 0.759 u[IU]/mL (ref 0.450–4.500)

## 2019-11-08 LAB — VITAMIN B12: Vitamin B-12: 222 pg/mL — ABNORMAL LOW (ref 232–1245)

## 2019-11-08 LAB — SEDIMENTATION RATE: Sed Rate: 38 mm/hr (ref 0–40)

## 2019-11-11 ENCOUNTER — Telehealth: Payer: Self-pay | Admitting: Neurology

## 2019-11-11 NOTE — Telephone Encounter (Signed)
Left second message requesting a return call. 

## 2019-11-11 NOTE — Telephone Encounter (Signed)
I called pt to discuss. No answer, left a message asking her to call me back. 

## 2019-11-11 NOTE — Telephone Encounter (Addendum)
Please call patient, laboratory evaluation showed  Low vitamin D level, 18.4, she should start over-the-counter vitamin D3 supplement 1000 units daily  Mildly low B12, start over-the-counter vitamin B12 supplements, 1000 mcg daily  Hemoglobin is low normal range 11.8, previously was 10.5 in November 2020, there is evidence of iron deficiency as evident by decreased MCV, MCH, increased RDW, she would benefit over-the-counter iron supplement   Rest of the laboratory evaluation showed no significant abnormalities, liver functional test was normal

## 2019-11-12 ENCOUNTER — Telehealth: Payer: Self-pay | Admitting: Neurology

## 2019-11-12 NOTE — Telephone Encounter (Signed)
I spoke to the patient this morning. She verbalized understanding of her lab results. She was agreeable to start the recommended supplements.

## 2019-11-12 NOTE — Telephone Encounter (Signed)
Maureen Bishop Maureen Bishop: [id number] (exp. 11/12/19 to 12/12/19) order sent to GI. They will reach out to the patient to schedule.

## 2019-11-16 ENCOUNTER — Other Ambulatory Visit: Payer: Self-pay

## 2019-11-16 ENCOUNTER — Ambulatory Visit
Admission: RE | Admit: 2019-11-16 | Discharge: 2019-11-16 | Disposition: A | Discharge status: Home / Self Care | Payer: Medicare HMO | Source: Ambulatory Visit | Attending: Neurology | Admitting: Neurology

## 2019-11-16 DIAGNOSIS — R259 Unspecified abnormal involuntary movements: Secondary | ICD-10-CM

## 2019-11-16 DIAGNOSIS — R292 Abnormal reflex: Secondary | ICD-10-CM

## 2019-11-18 NOTE — Progress Notes (Signed)
Maureen Bishop:  MRI of the brain showed age-related changes, there was no significant abnormalities.  Marcial Pacas, M.D. Ph.D.  [facility] [place], [place] [address] Phone: [phone] Fax:      [phone]

## 2019-11-20 ENCOUNTER — Telehealth: Payer: Self-pay | Admitting: Neurology

## 2019-11-20 DIAGNOSIS — E7849 Other hyperlipidemia: Secondary | ICD-10-CM | POA: Diagnosis not present

## 2019-11-20 DIAGNOSIS — M130 Polyarthritis, unspecified: Secondary | ICD-10-CM | POA: Diagnosis not present

## 2019-11-20 DIAGNOSIS — I2510 Atherosclerotic heart disease of native coronary artery without angina pectoris: Secondary | ICD-10-CM | POA: Diagnosis not present

## 2019-11-20 DIAGNOSIS — E1140 Type 2 diabetes mellitus with diabetic neuropathy, unspecified: Secondary | ICD-10-CM | POA: Diagnosis not present

## 2019-11-20 DIAGNOSIS — I10 Essential (primary) hypertension: Secondary | ICD-10-CM | POA: Diagnosis not present

## 2019-11-20 NOTE — Telephone Encounter (Signed)
Mychart message sent from Dr. Krista Blue:   Marcial Pacas, MD  11/18/2019 4:37 PM EDT     Mrs. Maureen Bishop:  MRI of the brain showed age-related changes, there was no significant abnormalities.  Marcial Pacas, M.D. Ph.D.   I called the patient and provided this information to her. She verbalized understanding.

## 2019-11-20 NOTE — Telephone Encounter (Signed)
Pt called wanting to know if her MRI results had come in yet. Please advise.

## 2019-12-10 DIAGNOSIS — I10 Essential (primary) hypertension: Secondary | ICD-10-CM | POA: Diagnosis not present

## 2019-12-10 DIAGNOSIS — E1169 Type 2 diabetes mellitus with other specified complication: Secondary | ICD-10-CM | POA: Diagnosis not present

## 2020-01-01 ENCOUNTER — Ambulatory Visit
Admission: RE | Admit: 2020-01-01 | Discharge: 2020-01-01 | Disposition: A | Discharge status: Home / Self Care | Payer: Medicare HMO | Source: Ambulatory Visit | Attending: Family Medicine | Admitting: Family Medicine

## 2020-01-01 ENCOUNTER — Other Ambulatory Visit: Payer: Self-pay | Admitting: Family Medicine

## 2020-01-01 DIAGNOSIS — M7731 Calcaneal spur, right foot: Secondary | ICD-10-CM | POA: Diagnosis not present

## 2020-01-01 DIAGNOSIS — S99911A Unspecified injury of right ankle, initial encounter: Secondary | ICD-10-CM | POA: Diagnosis not present

## 2020-01-01 DIAGNOSIS — M533 Sacrococcygeal disorders, not elsewhere classified: Secondary | ICD-10-CM | POA: Diagnosis not present

## 2020-01-01 DIAGNOSIS — S8991XA Unspecified injury of right lower leg, initial encounter: Secondary | ICD-10-CM | POA: Diagnosis not present

## 2020-01-01 DIAGNOSIS — M79604 Pain in right leg: Secondary | ICD-10-CM

## 2020-01-01 DIAGNOSIS — S99921A Unspecified injury of right foot, initial encounter: Secondary | ICD-10-CM | POA: Diagnosis not present

## 2020-01-01 DIAGNOSIS — S79921A Unspecified injury of right thigh, initial encounter: Secondary | ICD-10-CM | POA: Diagnosis not present

## 2020-01-01 DIAGNOSIS — S83011A Lateral subluxation of right patella, initial encounter: Secondary | ICD-10-CM | POA: Diagnosis not present

## 2020-01-01 DIAGNOSIS — S3992XA Unspecified injury of lower back, initial encounter: Secondary | ICD-10-CM | POA: Diagnosis not present

## 2020-01-01 DIAGNOSIS — M160 Bilateral primary osteoarthritis of hip: Secondary | ICD-10-CM | POA: Diagnosis not present

## 2020-01-07 DIAGNOSIS — I10 Essential (primary) hypertension: Secondary | ICD-10-CM | POA: Diagnosis not present

## 2020-01-07 DIAGNOSIS — M130 Polyarthritis, unspecified: Secondary | ICD-10-CM | POA: Diagnosis not present

## 2020-01-13 DIAGNOSIS — H2521 Age-related cataract, morgagnian type, right eye: Secondary | ICD-10-CM | POA: Diagnosis not present

## 2020-01-13 DIAGNOSIS — H2511 Age-related nuclear cataract, right eye: Secondary | ICD-10-CM | POA: Diagnosis not present

## 2020-01-13 DIAGNOSIS — H2522 Age-related cataract, morgagnian type, left eye: Secondary | ICD-10-CM | POA: Diagnosis not present

## 2020-01-13 DIAGNOSIS — H25011 Cortical age-related cataract, right eye: Secondary | ICD-10-CM | POA: Diagnosis not present

## 2020-01-14 DIAGNOSIS — Z961 Presence of intraocular lens: Secondary | ICD-10-CM | POA: Diagnosis not present

## 2020-01-14 DIAGNOSIS — E119 Type 2 diabetes mellitus without complications: Secondary | ICD-10-CM | POA: Diagnosis not present

## 2020-01-14 DIAGNOSIS — H43812 Vitreous degeneration, left eye: Secondary | ICD-10-CM | POA: Diagnosis not present

## 2020-01-14 DIAGNOSIS — H25011 Cortical age-related cataract, right eye: Secondary | ICD-10-CM | POA: Diagnosis not present

## 2020-01-28 DIAGNOSIS — H43812 Vitreous degeneration, left eye: Secondary | ICD-10-CM | POA: Diagnosis not present

## 2020-01-28 DIAGNOSIS — H524 Presbyopia: Secondary | ICD-10-CM | POA: Diagnosis not present

## 2020-01-28 DIAGNOSIS — E119 Type 2 diabetes mellitus without complications: Secondary | ICD-10-CM | POA: Diagnosis not present

## 2020-01-28 DIAGNOSIS — Z961 Presence of intraocular lens: Secondary | ICD-10-CM | POA: Diagnosis not present

## 2020-02-10 ENCOUNTER — Encounter: Payer: Self-pay | Admitting: Neurology

## 2020-02-10 ENCOUNTER — Ambulatory Visit: Payer: Medicare HMO | Admitting: Neurology

## 2020-02-10 NOTE — Progress Notes (Deleted)
PATIENT: Maureen Bishop DOB: [birth year]  REASON FOR VISIT: follow up HISTORY FROM: patient  HISTORY OF PRESENT ILLNESS: Today 02/10/20  HISTORY  Maureen Bishop is a 69 year old female, seen in request by her primary care physician Lucianne Lei, MD for evaluation of intermittent jerking movement, initial evaluation was on November 07, 2019.  I reviewed and summarized the referring note. She has past medical history of type 2 diabetes, hypertension, hyperlipidemia, known history of microcytic anemia, obesity, coronary artery disease,  She reported 6 months history of intermittent sudden onset uncontrollable body jerking movement, started since January 2021, she described sudden onset transient body jerking movement, it can involve her limbs or whole body, lasting for few seconds, she has no loss of consciousness, there was no sequelae aft the jerking movement, it happened sporadically, sometimes daily, sometimes few days without occurrence, she was not able to identify any triggers,  She still works as a Forensic psychologist, highly function, lives independently, did reported a history of excessive alcohol use in the past, reported abnormal liver functional test that is attributed to her excessive alcohol use, she is now conscious about it, but it also reported that during summertime, she often gathered with her friend around the pool, tends to over drink in a scenario like that  She was started on Lyrica 50/100 mg daily, which seems to help her symptoms  Update February 10, 2020 SS: Laboratory evaluation revealed low vitamin D 18.4, started OTC supplement 1000 units daily, mildly low B12, started OTC B12 1000 mcg daily, HGB 11.8 but evidence of iron deficiency started OTC Iron.  MRI of the brain showed age-related changes, no significant abnormalities  REVIEW OF SYSTEMS: Out of a complete 14 system review of symptoms, the patient complains only of the following symptoms, and all other  reviewed systems are negative.  ALLERGIES: Allergies  Allergen Reactions  . Sulfa Antibiotics Hives  . Sulfasalazine Hives    HOME MEDICATIONS: Outpatient Medications Prior to Visit  Medication Sig Dispense Refill  . acetaminophen (TYLENOL) 500 MG tablet Take 1,000 mg by mouth at bedtime.     Marland Kitchen amLODipine (NORVASC) 2.5 MG tablet Take 1 tablet (2.5 mg total) by mouth at bedtime for 30 days. 90 tablet 1  . atorvastatin (LIPITOR) 80 MG tablet Take 1 tablet (80 mg total) by mouth daily at 6 (six) AM. 90 tablet 3  . Cholecalciferol (VITAMIN D3 PO) Take 1,000 Units by mouth daily.    . clopidogrel (PLAVIX) 75 MG tablet Take 75 mg by mouth daily.    . famotidine (PEPCID) 20 MG tablet Take 1 tablet (20 mg total) by mouth 2 (two) times daily. 28 tablet 0  . Ferrous Sulfate (IRON PO) Take 1 tablet by mouth daily.    . Iron, Ferrous Sulfate, 325 (65 Fe) MG TABS Take 325 mg by mouth 2 (two) times daily. Take for 2 months only. 120 tablet 0  . metoprolol tartrate (LOPRESSOR) 25 MG tablet Take 1 tablet (25 mg total) by mouth 2 (two) times daily for 30 days. 180 tablet 1  . naproxen (NAPROSYN) 500 MG tablet Take 500 mg by mouth 2 (two) times daily as needed (for pain).    . nitroGLYCERIN (NITROSTAT) 0.4 MG SL tablet Place 1 tablet (0.4 mg total) under the tongue every 5 (five) minutes as needed for chest pain. 30 tablet 0  . pregabalin (LYRICA) 50 MG capsule Take 50-100 mg by mouth See admin instructions. Take 50 mg by mouth in  the morning and 100 mg at bedtime    . SitaGLIPtin-MetFORMIN HCl (JANUMET XR) [phone] MG TB24 Take 1 tablet by mouth at bedtime.    . vitamin B-12 (CYANOCOBALAMIN) 1000 MCG tablet Take 1,000 mcg by mouth daily.     No facility-administered medications prior to visit.    PAST MEDICAL HISTORY: Past Medical History:  Diagnosis Date  . CAD (coronary artery disease)    a. moderate CAD with 70% prox LAD, 75% mLAD (not physiologically significant by FFR), 70% D1, 75% OM1  (relatively small vessels) - medical therapy recommended, reserving PCI for refractory symptoms.  . Diabetes mellitus type 2 in obese ([place])   . Former tobacco use   . Hyperlipidemia   . Hypertension   . Microcytic anemia    a. noted on labs 08/2018.  . Obesity     PAST SURGICAL HISTORY: Past Surgical History:  Procedure Laterality Date  . CESAREAN SECTION    . INTRAVASCULAR PRESSURE WIRE/FFR STUDY N/A 09/06/2018   Procedure: INTRAVASCULAR PRESSURE WIRE/FFR STUDY;  Surgeon: Lorretta Harp, MD;  Location: [place] [facility];  Service: Cardiovascular;  Laterality: N/A;  . LEFT HEART CATH AND CORONARY ANGIOGRAPHY N/A 09/06/2018   Procedure: LEFT HEART CATH AND CORONARY ANGIOGRAPHY;  Surgeon: Lorretta Harp, MD;  Location: [place] [facility];  Service: Cardiovascular;  Laterality: N/A;    FAMILY HISTORY: Family History  Problem Relation Age of Onset  . CAD Father   . Heart attack Father        died at 29 y/o from MI   . Diabetes Mother     SOCIAL HISTORY: Social History   Socioeconomic History  . Marital status: Widowed    Spouse name: Not on file  . Number of children: Not on file  . Years of education: Not on file  . Highest education level: Not on file  Occupational History  . Not on file  Tobacco Use  . Smoking status: Former Research scientist (life sciences)  . Smokeless tobacco: Never Used  Vaping Use  . Vaping Use: Never used  Substance and Sexual Activity  . Alcohol use: Yes    Alcohol/week: 10.0 standard drinks    Types: 10 Glasses of wine per week  . Drug use: No  . Sexual activity: Yes    Birth control/protection: Post-menopausal  Other Topics Concern  . Not on file  Social History Narrative  . Not on file   Social Determinants of Health   Financial Resource Strain:   . Difficulty of Paying Living Expenses: Not on file  Food Insecurity:   . Worried About Charity fundraiser in the Last Year: Not on file  . Ran Out of Food in the Last Year: Not on file  Transportation  Needs:   . Lack of Transportation (Medical): Not on file  . Lack of Transportation (Non-Medical): Not on file  Physical Activity:   . Days of Exercise per Week: Not on file  . Minutes of Exercise per Session: Not on file  Stress:   . Feeling of Stress : Not on file  Social Connections:   . Frequency of Communication with Friends and Family: Not on file  . Frequency of Social Gatherings with Friends and Family: Not on file  . Attends Religious Services: Not on file  . Active Member of Clubs or Organizations: Not on file  . Attends Archivist Meetings: Not on file  . Marital Status: Not on file  Intimate Partner Violence:   .  Fear of Current or Ex-Partner: Not on file  . Emotionally Abused: Not on file  . Physically Abused: Not on file  . Sexually Abused: Not on file      PHYSICAL EXAM  There were no vitals filed for this visit. There is no height or weight on file to calculate BMI.  Generalized: Well developed, in no acute distress   Neurological examination  Mentation: Alert oriented to time, place, history taking. Follows all commands speech and language fluent Cranial nerve II-XII: Pupils were equal round reactive to light. Extraocular movements were full, visual field were full on confrontational test. Facial sensation and strength were normal. Uvula tongue midline. Head turning and shoulder shrug  were normal and symmetric. Motor: The motor testing reveals 5 over 5 strength of all 4 extremities. Good symmetric motor tone is noted throughout.  Sensory: Sensory testing is intact to soft touch on all 4 extremities. No evidence of extinction is noted.  Coordination: Cerebellar testing reveals good finger-nose-finger and heel-to-shin bilaterally.  Gait and station: Gait is normal. Tandem gait is normal. Romberg is negative. No drift is seen.  Reflexes: Deep tendon reflexes are symmetric and normal bilaterally.   DIAGNOSTIC DATA (LABS, IMAGING, TESTING) - I reviewed  patient records, labs, notes, testing and imaging myself where available.  Lab Results  Component Value Date   WBC 8.7 11/07/2019   HGB 11.8 11/07/2019   HCT 39.3 11/07/2019   MCV 74 (L) 11/07/2019   PLT 303 11/07/2019      Component Value Date/Time   NA 142 11/07/2019 1558   K 4.6 11/07/2019 1558   CL 104 11/07/2019 1558   CO2 23 11/07/2019 1558   GLUCOSE 88 11/07/2019 1558   GLUCOSE 107 (H) 04/21/2019 1645   BUN 14 11/07/2019 1558   CREATININE 0.85 11/07/2019 1558   CALCIUM 9.4 11/07/2019 1558   PROT 7.5 11/07/2019 1558   ALBUMIN 4.3 11/07/2019 1558   AST 18 11/07/2019 1558   ALT 13 11/07/2019 1558   ALKPHOS 78 11/07/2019 1558   BILITOT 0.2 11/07/2019 1558   GFRNONAA 69 11/07/2019 1558   GFRAA 80 11/07/2019 1558   Lab Results  Component Value Date   CHOL 137 10/25/2018   HDL 44 10/25/2018   LDLCALC 79 10/25/2018   TRIG 71 10/25/2018   CHOLHDL 3.1 10/25/2018   Lab Results  Component Value Date   HGBA1C 6.1 (H) 09/06/2018   Lab Results  Component Value Date   YIRSWNIO27 035 (L) 11/07/2019   Lab Results  Component Value Date   TSH 0.759 11/07/2019      ASSESSMENT AND PLAN 69 y.o. year old female  has a past medical history of CAD (coronary artery disease), Diabetes mellitus type 2 in obese ([place]), Former tobacco use, Hyperlipidemia, Hypertension, Microcytic anemia, and Obesity. here with:  1.  History of alcohol abuse, reported history abnormal liver function test 2.  Recurrent jerking movement -Likely myoclonic jerking -MRI of the brain showed age-related changes, no significant abnormalities -Extensive laboratory evaluation (B12, RPR, HIV, folate, CRP, TSH, CK, sed rate, vitamin D, CBC, CMP) showed low B12, vitamin D, mildly low Hgb likely IDA   I spent 15 minutes with the patient. 50% of this time was spent   Butler Denmark, [place], DNP 02/10/2020, 5:43 AM [facility] [address], [place], [place] [address] [phone]

## 2020-03-15 ENCOUNTER — Other Ambulatory Visit: Payer: Self-pay | Admitting: Cardiology

## 2020-03-15 DIAGNOSIS — E785 Hyperlipidemia, unspecified: Secondary | ICD-10-CM

## 2020-03-21 DIAGNOSIS — E1140 Type 2 diabetes mellitus with diabetic neuropathy, unspecified: Secondary | ICD-10-CM | POA: Diagnosis not present

## 2020-03-21 DIAGNOSIS — M130 Polyarthritis, unspecified: Secondary | ICD-10-CM | POA: Diagnosis not present

## 2020-03-21 DIAGNOSIS — I2510 Atherosclerotic heart disease of native coronary artery without angina pectoris: Secondary | ICD-10-CM | POA: Diagnosis not present

## 2020-03-21 DIAGNOSIS — E7849 Other hyperlipidemia: Secondary | ICD-10-CM | POA: Diagnosis not present

## 2020-03-31 DIAGNOSIS — E782 Mixed hyperlipidemia: Secondary | ICD-10-CM | POA: Diagnosis not present

## 2020-03-31 DIAGNOSIS — K700 Alcoholic fatty liver: Secondary | ICD-10-CM | POA: Diagnosis not present

## 2020-03-31 DIAGNOSIS — R10813 Right lower quadrant abdominal tenderness: Secondary | ICD-10-CM | POA: Diagnosis not present

## 2020-03-31 DIAGNOSIS — R700 Elevated erythrocyte sedimentation rate: Secondary | ICD-10-CM | POA: Diagnosis not present

## 2020-03-31 DIAGNOSIS — E1169 Type 2 diabetes mellitus with other specified complication: Secondary | ICD-10-CM | POA: Diagnosis not present

## 2020-03-31 DIAGNOSIS — I10 Essential (primary) hypertension: Secondary | ICD-10-CM | POA: Diagnosis not present

## 2020-04-03 DIAGNOSIS — K700 Alcoholic fatty liver: Secondary | ICD-10-CM | POA: Diagnosis not present

## 2020-04-03 DIAGNOSIS — E782 Mixed hyperlipidemia: Secondary | ICD-10-CM | POA: Diagnosis not present

## 2020-04-03 DIAGNOSIS — R143 Flatulence: Secondary | ICD-10-CM | POA: Diagnosis not present

## 2020-04-03 DIAGNOSIS — R10813 Right lower quadrant abdominal tenderness: Secondary | ICD-10-CM | POA: Diagnosis not present

## 2020-04-21 DIAGNOSIS — I10 Essential (primary) hypertension: Secondary | ICD-10-CM | POA: Diagnosis not present

## 2020-04-21 DIAGNOSIS — E1140 Type 2 diabetes mellitus with diabetic neuropathy, unspecified: Secondary | ICD-10-CM | POA: Diagnosis not present

## 2020-04-21 DIAGNOSIS — E7849 Other hyperlipidemia: Secondary | ICD-10-CM | POA: Diagnosis not present

## 2020-04-23 ENCOUNTER — Other Ambulatory Visit: Payer: Self-pay

## 2020-04-23 ENCOUNTER — Encounter (HOSPITAL_COMMUNITY): Payer: Self-pay

## 2020-04-23 ENCOUNTER — Ambulatory Visit (HOSPITAL_COMMUNITY)
Admission: EM | Admit: 2020-04-23 | Discharge: 2020-04-23 | Disposition: A | Discharge status: Home / Self Care | Payer: Medicare HMO | Attending: Internal Medicine | Admitting: Internal Medicine

## 2020-04-23 DIAGNOSIS — K2100 Gastro-esophageal reflux disease with esophagitis, without bleeding: Secondary | ICD-10-CM

## 2020-04-23 DIAGNOSIS — Z20822 Contact with and (suspected) exposure to covid-19: Secondary | ICD-10-CM | POA: Insufficient documentation

## 2020-04-23 DIAGNOSIS — R079 Chest pain, unspecified: Secondary | ICD-10-CM | POA: Diagnosis not present

## 2020-04-23 LAB — SARS CORONAVIRUS 2 (TAT 6-24 HRS): SARS Coronavirus 2: NEGATIVE

## 2020-04-23 MED ORDER — LIDOCAINE VISCOUS HCL 2 % MT SOLN
OROMUCOSAL | Status: AC
  Filled 2020-04-23: qty 15

## 2020-04-23 MED ORDER — SUCRALFATE 1 GM/10ML PO SUSP
1.0000 g | Freq: Three times a day (TID) | ORAL | 0 refills | Status: AC
Start: 2020-04-23 — End: ?

## 2020-04-23 MED ORDER — ALUM & MAG HYDROXIDE-SIMETH 200-200-20 MG/5ML PO SUSP
ORAL | Status: AC
  Filled 2020-04-23: qty 30

## 2020-04-23 MED ORDER — ALUM & MAG HYDROXIDE-SIMETH 200-200-20 MG/5ML PO SUSP
30.0000 mL | Freq: Once | ORAL | Status: AC
  Administered 2020-04-23: 30 mL via ORAL

## 2020-04-23 MED ORDER — LIDOCAINE VISCOUS HCL 2 % MT SOLN
15.0000 mL | Freq: Once | OROMUCOSAL | Status: AC
  Administered 2020-04-23: 15 mL via ORAL

## 2020-04-23 NOTE — Discharge Instructions (Addendum)
Please take medications as prescribed If symptoms worsen please return to the urgent care to be reevaluated Your symptoms are most likely secondary to gastroesophageal reflux disease.

## 2020-04-23 NOTE — ED Triage Notes (Signed)
Pt presents with central chest pain X 1 week with some shortness of breath and weakness.

## 2020-04-23 NOTE — ED Provider Notes (Signed)
[place]    CSN: [id number] Arrival date & time: 04/23/20  0840      History   Chief Complaint Chief Complaint  Patient presents with  . Chest Pain    HPI Maureen Bishop is a 69 y.o. female comes to urgent care with complaints of upper abdominal and lower chest pain of 1 week duration.  Patient says the pain gets worse when she lays flat.  No known relieving factors.  No NSAID use on a regular basis.  Pain does not radiate to the jaw or to the left shoulder.  Patient had cardiac catheterization in April 2020.  She had multivessel disease not amenable to PCI.  Pain does not worsen with physical activity.  No dizziness, diaphoresis, near syncope or syncopal episodes.   HPI  Past Medical History:  Diagnosis Date  . CAD (coronary artery disease)    a. moderate CAD with 70% prox LAD, 75% mLAD (not physiologically significant by FFR), 70% D1, 75% OM1 (relatively small vessels) - medical therapy recommended, reserving PCI for refractory symptoms.  . Diabetes mellitus type 2 in obese ([place])   . Former tobacco use   . Hyperlipidemia   . Hypertension   . Microcytic anemia    a. noted on labs 08/2018.  . Obesity     Patient Active Problem List   Diagnosis Date Noted  . Involuntary movements 11/07/2019  . Hyperreflexia 11/07/2019  . Coronary artery disease, non-occlusive 09/13/2018  . Chest pain 09/05/2018  . Benign essential HTN 09/05/2018  . Type 2 diabetes mellitus without complication ([place]) 29/93/7169  . Hyperlipidemia 09/05/2018    Past Surgical History:  Procedure Laterality Date  . CESAREAN SECTION    . INTRAVASCULAR PRESSURE WIRE/FFR STUDY N/A 09/06/2018   Procedure: INTRAVASCULAR PRESSURE WIRE/FFR STUDY;  Surgeon: Lorretta Harp, MD;  Location: [place] [facility];  Service: Cardiovascular;  Laterality: N/A;  . LEFT HEART CATH AND CORONARY ANGIOGRAPHY N/A 09/06/2018   Procedure: LEFT HEART CATH AND CORONARY ANGIOGRAPHY;  Surgeon: Lorretta Harp, MD;   Location: [place] [facility];  Service: Cardiovascular;  Laterality: N/A;    OB History    Gravida  2   Para  2   Term      Preterm      AB      Living  2     SAB      TAB      Ectopic      Multiple      Live Births               Home Medications    Prior to Admission medications   Medication Sig Start Date End Date Taking? Authorizing Provider  acetaminophen (TYLENOL) 500 MG tablet Take 1,000 mg by mouth at bedtime.     [provider]  amLODipine (NORVASC) 2.5 MG tablet Take 1 tablet (2.5 mg total) by mouth at bedtime for 30 days. 09/13/18 10/13/18  Ledora Bottcher, PA  atorvastatin (LIPITOR) 80 MG tablet TAKE 1 TABLET (80 MG TOTAL) BY MOUTH DAILY AT 6 (SIX) AM. 03/16/20   Dorothy Spark, MD  Cholecalciferol (VITAMIN D3 PO) Take 1,000 Units by mouth daily.    [provider]  clopidogrel (PLAVIX) 75 MG tablet Take 75 mg by mouth daily.    [provider]  famotidine (PEPCID) 20 MG tablet Take 1 tablet (20 mg total) by mouth 2 (two) times daily. 04/21/19   Blanchie Dessert, MD  Ferrous  Sulfate (IRON PO) Take 1 tablet by mouth daily.    [provider]  Iron, Ferrous Sulfate, 325 (65 Fe) MG TABS Take 325 mg by mouth 2 (two) times daily. Take for 2 months only. 02/08/19   Dorothy Spark, MD  metoprolol tartrate (LOPRESSOR) 25 MG tablet Take 1 tablet (25 mg total) by mouth 2 (two) times daily for 30 days. 09/13/18 10/13/18  Duke, Tami Lin, PA  nitroGLYCERIN (NITROSTAT) 0.4 MG SL tablet Place 1 tablet (0.4 mg total) under the tongue every 5 (five) minutes as needed for chest pain. 09/07/18   Amin, Jeanella Flattery, MD  pregabalin (LYRICA) 50 MG capsule Take 50-100 mg by mouth See admin instructions. Take 50 mg by mouth in the morning and 100 mg at bedtime    [provider]  SitaGLIPtin-MetFORMIN HCl (JANUMET XR) [phone] MG TB24 Take 1 tablet by mouth at bedtime.    [provider]  sucralfate (CARAFATE) 1  GM/10ML suspension Take 10 mLs (1 g total) by mouth 4 (four) times daily -  with meals and at bedtime. 04/23/20   LampteyMyrene Galas, MD  vitamin B-12 (CYANOCOBALAMIN) 1000 MCG tablet Take 1,000 mcg by mouth daily.    [provider]    Family History Family History  Problem Relation Age of Onset  . CAD Father   . Heart attack Father        died at 76 y/o from MI   . Diabetes Mother     Social History Social History   Tobacco Use  . Smoking status: Former Research scientist (life sciences)  . Smokeless tobacco: Never Used  Vaping Use  . Vaping Use: Never used  Substance Use Topics  . Alcohol use: Yes    Alcohol/week: 10.0 standard drinks    Types: 10 Glasses of wine per week  . Drug use: No     Allergies   Sulfa antibiotics and Sulfasalazine   Review of Systems Review of Systems  Constitutional: Negative.   HENT: Negative.   Respiratory: Negative for cough, chest tightness, shortness of breath and wheezing.   Cardiovascular: Positive for chest pain.  Gastrointestinal: Positive for abdominal pain. Negative for diarrhea, nausea and vomiting.  Genitourinary: Negative.   Musculoskeletal: Negative.   Neurological: Negative.      Physical Exam Triage Vital Signs ED Triage Vitals  Enc Vitals Group     BP 04/23/20 0848 135/64     Pulse Rate 04/23/20 0848 (!) 56     Resp 04/23/20 0848 16     Temp 04/23/20 0848 97.9 F (36.6 C)     Temp Source 04/23/20 0848 Oral     SpO2 04/23/20 0848 98 %     Weight --      Height --      Head Circumference --      Peak Flow --      Pain Score 04/23/20 0849 6     Pain Loc --      Pain Edu? --      Excl. in [place]? --    No data found.  Updated Vital Signs BP 135/64 (BP Location: Right Arm)   Pulse (!) 56   Temp 97.9 F (36.6 C) (Oral)   Resp 16   SpO2 98%   Visual Acuity Right Eye Distance:   Left Eye Distance:   Bilateral Distance:    Right Eye Near:   Left Eye Near:    Bilateral Near:     Physical Exam Vitals and nursing note  reviewed.  Constitutional:      General: She is not in acute distress.    Appearance: She is not ill-appearing.  Cardiovascular:     Heart sounds: Normal heart sounds.  Pulmonary:     Effort: Pulmonary effort is normal. No tachypnea or accessory muscle usage.     Breath sounds: Normal breath sounds. No decreased breath sounds, wheezing or rhonchi.  Abdominal:     General: Bowel sounds are normal.     Palpations: Abdomen is soft.     Tenderness: There is abdominal tenderness. There is no guarding or rebound.  Musculoskeletal:        General: Normal range of motion.  Skin:    General: Skin is warm.  Neurological:     Mental Status: She is alert.      UC Treatments / Results  Labs (all labs ordered are listed, but only abnormal results are displayed) Labs Reviewed  SARS CORONAVIRUS 2 (TAT 6-24 HRS)    EKG   Radiology No results found.  Procedures Procedures (including critical care time)  Medications Ordered in UC Medications  alum & mag hydroxide-simeth (MAALOX/MYLANTA) 200-200-20 MG/5ML suspension 30 mL (30 mLs Oral Given 04/23/20 1026)    And  lidocaine (XYLOCAINE) 2 % viscous mouth solution 15 mL (15 mLs Oral Given 04/23/20 1025)    Initial Impression / Assessment and Plan / UC Course  I have reviewed the triage vital signs and the nursing notes.  Pertinent labs & imaging results that were available during my care of the patient were reviewed by me and considered in my medical decision making (see chart for details).     1.  Gastroesophageal reflux symptoms with possible esophagitis: GI cocktail resulted in improvement of the patient's symptoms Patient is currently on famotidine We will add short course of Carafate to the treatment regimen If patient symptoms worsens she is advised to return to the urgent care to be reevaluated. If patient's symptoms are persistent despite acid suppressant regimen she may benefit from GI evaluation. Final Clinical  Impressions(s) / UC Diagnoses   Final diagnoses:  Gastroesophageal reflux disease with esophagitis without hemorrhage     Discharge Instructions     Please take medications as prescribed If symptoms worsen please return to the urgent care to be reevaluated Your symptoms are most likely secondary to gastroesophageal reflux disease.   ED Prescriptions    Medication Sig Dispense Auth. Provider   sucralfate (CARAFATE) 1 GM/10ML suspension Take 10 mLs (1 g total) by mouth 4 (four) times daily -  with meals and at bedtime. 420 mL Rashawn Rayman, Myrene Galas, MD     PDMP not reviewed this encounter.   Chase Picket, MD 04/23/20 [phone]

## 2020-05-09 DIAGNOSIS — F064 Anxiety disorder due to known physiological condition: Secondary | ICD-10-CM | POA: Diagnosis not present

## 2020-05-09 DIAGNOSIS — Z23 Encounter for immunization: Secondary | ICD-10-CM | POA: Diagnosis not present

## 2020-05-09 DIAGNOSIS — E1169 Type 2 diabetes mellitus with other specified complication: Secondary | ICD-10-CM | POA: Diagnosis not present

## 2020-05-09 DIAGNOSIS — F930 Separation anxiety disorder of childhood: Secondary | ICD-10-CM | POA: Diagnosis not present

## 2020-05-22 DIAGNOSIS — E1140 Type 2 diabetes mellitus with diabetic neuropathy, unspecified: Secondary | ICD-10-CM | POA: Diagnosis not present

## 2020-05-22 DIAGNOSIS — I10 Essential (primary) hypertension: Secondary | ICD-10-CM | POA: Diagnosis not present

## 2020-05-22 DIAGNOSIS — I2510 Atherosclerotic heart disease of native coronary artery without angina pectoris: Secondary | ICD-10-CM | POA: Diagnosis not present

## 2020-05-22 DIAGNOSIS — E7849 Other hyperlipidemia: Secondary | ICD-10-CM | POA: Diagnosis not present

## 2020-06-12 DIAGNOSIS — I10 Essential (primary) hypertension: Secondary | ICD-10-CM | POA: Diagnosis not present

## 2020-06-12 DIAGNOSIS — F1290 Cannabis use, unspecified, uncomplicated: Secondary | ICD-10-CM | POA: Diagnosis not present

## 2020-06-12 DIAGNOSIS — F330 Major depressive disorder, recurrent, mild: Secondary | ICD-10-CM | POA: Diagnosis not present

## 2020-06-15 DIAGNOSIS — Z01419 Encounter for gynecological examination (general) (routine) without abnormal findings: Secondary | ICD-10-CM | POA: Diagnosis not present

## 2020-06-15 DIAGNOSIS — N950 Postmenopausal bleeding: Secondary | ICD-10-CM | POA: Diagnosis not present

## 2020-06-15 DIAGNOSIS — B354 Tinea corporis: Secondary | ICD-10-CM | POA: Diagnosis not present

## 2020-06-15 DIAGNOSIS — N819 Female genital prolapse, unspecified: Secondary | ICD-10-CM | POA: Diagnosis not present

## 2020-06-15 DIAGNOSIS — Z1231 Encounter for screening mammogram for malignant neoplasm of breast: Secondary | ICD-10-CM | POA: Diagnosis not present

## 2020-06-15 DIAGNOSIS — Z6832 Body mass index (BMI) 32.0-32.9, adult: Secondary | ICD-10-CM | POA: Diagnosis not present

## 2020-07-20 DIAGNOSIS — E7849 Other hyperlipidemia: Secondary | ICD-10-CM | POA: Diagnosis not present

## 2020-07-20 DIAGNOSIS — I2510 Atherosclerotic heart disease of native coronary artery without angina pectoris: Secondary | ICD-10-CM | POA: Diagnosis not present

## 2020-07-20 DIAGNOSIS — M130 Polyarthritis, unspecified: Secondary | ICD-10-CM | POA: Diagnosis not present

## 2020-07-20 DIAGNOSIS — E1140 Type 2 diabetes mellitus with diabetic neuropathy, unspecified: Secondary | ICD-10-CM | POA: Diagnosis not present

## 2020-07-29 DIAGNOSIS — N950 Postmenopausal bleeding: Secondary | ICD-10-CM | POA: Diagnosis not present

## 2020-09-16 DIAGNOSIS — R7309 Other abnormal glucose: Secondary | ICD-10-CM | POA: Diagnosis not present

## 2020-09-16 DIAGNOSIS — E782 Mixed hyperlipidemia: Secondary | ICD-10-CM | POA: Diagnosis not present

## 2020-09-16 DIAGNOSIS — E1169 Type 2 diabetes mellitus with other specified complication: Secondary | ICD-10-CM | POA: Diagnosis not present

## 2020-09-16 DIAGNOSIS — I10 Essential (primary) hypertension: Secondary | ICD-10-CM | POA: Diagnosis not present

## 2020-09-16 DIAGNOSIS — E7800 Pure hypercholesterolemia, unspecified: Secondary | ICD-10-CM | POA: Diagnosis not present

## 2020-09-16 DIAGNOSIS — F4321 Adjustment disorder with depressed mood: Secondary | ICD-10-CM | POA: Diagnosis not present

## 2020-10-27 DIAGNOSIS — I1310 Hypertensive heart and chronic kidney disease without heart failure, with stage 1 through stage 4 chronic kidney disease, or unspecified chronic kidney disease: Secondary | ICD-10-CM | POA: Diagnosis not present

## 2020-10-27 DIAGNOSIS — F1290 Cannabis use, unspecified, uncomplicated: Secondary | ICD-10-CM | POA: Diagnosis not present

## 2020-10-27 DIAGNOSIS — M130 Polyarthritis, unspecified: Secondary | ICD-10-CM | POA: Diagnosis not present

## 2020-10-27 DIAGNOSIS — E0800 Diabetes mellitus due to underlying condition with hyperosmolarity without nonketotic hyperglycemic-hyperosmolar coma (NKHHC): Secondary | ICD-10-CM | POA: Diagnosis not present

## 2020-10-27 DIAGNOSIS — M79604 Pain in right leg: Secondary | ICD-10-CM | POA: Diagnosis not present

## 2020-10-27 DIAGNOSIS — I10 Essential (primary) hypertension: Secondary | ICD-10-CM | POA: Diagnosis not present

## 2020-10-27 DIAGNOSIS — F330 Major depressive disorder, recurrent, mild: Secondary | ICD-10-CM | POA: Diagnosis not present

## 2020-10-27 DIAGNOSIS — Z634 Disappearance and death of family member: Secondary | ICD-10-CM | POA: Diagnosis not present

## 2020-10-28 DIAGNOSIS — M5441 Lumbago with sciatica, right side: Secondary | ICD-10-CM | POA: Diagnosis not present

## 2020-11-19 DIAGNOSIS — I2510 Atherosclerotic heart disease of native coronary artery without angina pectoris: Secondary | ICD-10-CM | POA: Diagnosis not present

## 2020-11-19 DIAGNOSIS — E1140 Type 2 diabetes mellitus with diabetic neuropathy, unspecified: Secondary | ICD-10-CM | POA: Diagnosis not present

## 2020-11-19 DIAGNOSIS — E7849 Other hyperlipidemia: Secondary | ICD-10-CM | POA: Diagnosis not present

## 2020-11-19 DIAGNOSIS — I10 Essential (primary) hypertension: Secondary | ICD-10-CM | POA: Diagnosis not present

## 2020-12-02 DIAGNOSIS — Z20828 Contact with and (suspected) exposure to other viral communicable diseases: Secondary | ICD-10-CM | POA: Diagnosis not present

## 2020-12-09 DIAGNOSIS — Z20822 Contact with and (suspected) exposure to covid-19: Secondary | ICD-10-CM | POA: Diagnosis not present

## 2020-12-09 DIAGNOSIS — Z03818 Encounter for observation for suspected exposure to other biological agents ruled out: Secondary | ICD-10-CM | POA: Diagnosis not present

## 2020-12-20 DIAGNOSIS — E7849 Other hyperlipidemia: Secondary | ICD-10-CM | POA: Diagnosis not present

## 2020-12-20 DIAGNOSIS — E1140 Type 2 diabetes mellitus with diabetic neuropathy, unspecified: Secondary | ICD-10-CM | POA: Diagnosis not present

## 2020-12-20 DIAGNOSIS — I10 Essential (primary) hypertension: Secondary | ICD-10-CM | POA: Diagnosis not present

## 2020-12-20 DIAGNOSIS — I2510 Atherosclerotic heart disease of native coronary artery without angina pectoris: Secondary | ICD-10-CM | POA: Diagnosis not present

## 2021-01-02 DIAGNOSIS — Z03818 Encounter for observation for suspected exposure to other biological agents ruled out: Secondary | ICD-10-CM | POA: Diagnosis not present

## 2021-01-02 DIAGNOSIS — Z20822 Contact with and (suspected) exposure to covid-19: Secondary | ICD-10-CM | POA: Diagnosis not present

## 2021-01-06 DIAGNOSIS — I10 Essential (primary) hypertension: Secondary | ICD-10-CM | POA: Diagnosis not present

## 2021-01-06 DIAGNOSIS — E1169 Type 2 diabetes mellitus with other specified complication: Secondary | ICD-10-CM | POA: Diagnosis not present

## 2021-01-18 DIAGNOSIS — Z20822 Contact with and (suspected) exposure to covid-19: Secondary | ICD-10-CM | POA: Diagnosis not present

## 2021-02-18 DIAGNOSIS — F330 Major depressive disorder, recurrent, mild: Secondary | ICD-10-CM | POA: Diagnosis not present

## 2021-02-18 DIAGNOSIS — I10 Essential (primary) hypertension: Secondary | ICD-10-CM | POA: Diagnosis not present

## 2021-02-18 DIAGNOSIS — I1310 Hypertensive heart and chronic kidney disease without heart failure, with stage 1 through stage 4 chronic kidney disease, or unspecified chronic kidney disease: Secondary | ICD-10-CM | POA: Diagnosis not present

## 2021-02-18 DIAGNOSIS — E1169 Type 2 diabetes mellitus with other specified complication: Secondary | ICD-10-CM | POA: Diagnosis not present

## 2021-02-18 DIAGNOSIS — I130 Hypertensive heart and chronic kidney disease with heart failure and stage 1 through stage 4 chronic kidney disease, or unspecified chronic kidney disease: Secondary | ICD-10-CM | POA: Diagnosis not present

## 2021-02-18 DIAGNOSIS — E782 Mixed hyperlipidemia: Secondary | ICD-10-CM | POA: Diagnosis not present

## 2021-02-18 DIAGNOSIS — Z0001 Encounter for general adult medical examination with abnormal findings: Secondary | ICD-10-CM | POA: Diagnosis not present

## 2021-03-11 DIAGNOSIS — F330 Major depressive disorder, recurrent, mild: Secondary | ICD-10-CM | POA: Diagnosis not present

## 2021-03-11 DIAGNOSIS — E1169 Type 2 diabetes mellitus with other specified complication: Secondary | ICD-10-CM | POA: Diagnosis not present

## 2021-03-22 DIAGNOSIS — I10 Essential (primary) hypertension: Secondary | ICD-10-CM | POA: Diagnosis not present

## 2021-03-22 DIAGNOSIS — E1140 Type 2 diabetes mellitus with diabetic neuropathy, unspecified: Secondary | ICD-10-CM | POA: Diagnosis not present

## 2021-03-22 DIAGNOSIS — E7849 Other hyperlipidemia: Secondary | ICD-10-CM | POA: Diagnosis not present

## 2021-04-10 DIAGNOSIS — Z23 Encounter for immunization: Secondary | ICD-10-CM | POA: Diagnosis not present

## 2021-04-19 DIAGNOSIS — J020 Streptococcal pharyngitis: Secondary | ICD-10-CM | POA: Diagnosis not present

## 2021-05-21 DIAGNOSIS — E1140 Type 2 diabetes mellitus with diabetic neuropathy, unspecified: Secondary | ICD-10-CM | POA: Diagnosis not present

## 2021-05-21 DIAGNOSIS — I2510 Atherosclerotic heart disease of native coronary artery without angina pectoris: Secondary | ICD-10-CM | POA: Diagnosis not present

## 2021-05-21 DIAGNOSIS — E7849 Other hyperlipidemia: Secondary | ICD-10-CM | POA: Diagnosis not present

## 2021-05-21 DIAGNOSIS — I10 Essential (primary) hypertension: Secondary | ICD-10-CM | POA: Diagnosis not present

## 2021-06-08 DIAGNOSIS — Z6834 Body mass index (BMI) 34.0-34.9, adult: Secondary | ICD-10-CM | POA: Diagnosis not present

## 2021-06-08 DIAGNOSIS — K625 Hemorrhage of anus and rectum: Secondary | ICD-10-CM | POA: Diagnosis not present

## 2021-06-08 DIAGNOSIS — I10 Essential (primary) hypertension: Secondary | ICD-10-CM | POA: Diagnosis not present

## 2021-06-08 DIAGNOSIS — F330 Major depressive disorder, recurrent, mild: Secondary | ICD-10-CM | POA: Diagnosis not present

## 2021-06-08 DIAGNOSIS — E1169 Type 2 diabetes mellitus with other specified complication: Secondary | ICD-10-CM | POA: Diagnosis not present

## 2021-06-15 DIAGNOSIS — R399 Unspecified symptoms and signs involving the genitourinary system: Secondary | ICD-10-CM | POA: Diagnosis not present

## 2021-06-15 DIAGNOSIS — Z1231 Encounter for screening mammogram for malignant neoplasm of breast: Secondary | ICD-10-CM | POA: Diagnosis not present

## 2021-06-15 DIAGNOSIS — R102 Pelvic and perineal pain: Secondary | ICD-10-CM | POA: Diagnosis not present

## 2021-06-15 DIAGNOSIS — N390 Urinary tract infection, site not specified: Secondary | ICD-10-CM | POA: Diagnosis not present

## 2021-06-15 DIAGNOSIS — N819 Female genital prolapse, unspecified: Secondary | ICD-10-CM | POA: Diagnosis not present

## 2021-06-22 DIAGNOSIS — F330 Major depressive disorder, recurrent, mild: Secondary | ICD-10-CM | POA: Diagnosis not present

## 2021-06-22 DIAGNOSIS — I10 Essential (primary) hypertension: Secondary | ICD-10-CM | POA: Diagnosis not present

## 2021-06-22 DIAGNOSIS — E782 Mixed hyperlipidemia: Secondary | ICD-10-CM | POA: Diagnosis not present

## 2021-06-22 DIAGNOSIS — K642 Third degree hemorrhoids: Secondary | ICD-10-CM | POA: Diagnosis not present

## 2021-06-28 DIAGNOSIS — U071 COVID-19: Secondary | ICD-10-CM | POA: Diagnosis not present

## 2021-06-28 DIAGNOSIS — Z03818 Encounter for observation for suspected exposure to other biological agents ruled out: Secondary | ICD-10-CM | POA: Diagnosis not present

## 2021-06-29 DIAGNOSIS — Z03818 Encounter for observation for suspected exposure to other biological agents ruled out: Secondary | ICD-10-CM | POA: Diagnosis not present

## 2021-06-29 DIAGNOSIS — U071 COVID-19: Secondary | ICD-10-CM | POA: Diagnosis not present

## 2021-07-13 DIAGNOSIS — R102 Pelvic and perineal pain: Secondary | ICD-10-CM | POA: Diagnosis not present

## 2021-08-09 DIAGNOSIS — Z03818 Encounter for observation for suspected exposure to other biological agents ruled out: Secondary | ICD-10-CM | POA: Diagnosis not present

## 2021-08-09 DIAGNOSIS — U071 COVID-19: Secondary | ICD-10-CM | POA: Diagnosis not present

## 2021-08-12 DIAGNOSIS — M545 Low back pain, unspecified: Secondary | ICD-10-CM | POA: Diagnosis not present

## 2021-08-13 DIAGNOSIS — Z0000 Encounter for general adult medical examination without abnormal findings: Secondary | ICD-10-CM | POA: Diagnosis not present

## 2021-08-13 DIAGNOSIS — Z6832 Body mass index (BMI) 32.0-32.9, adult: Secondary | ICD-10-CM | POA: Diagnosis not present

## 2021-08-13 DIAGNOSIS — M5430 Sciatica, unspecified side: Secondary | ICD-10-CM | POA: Diagnosis not present

## 2021-08-13 DIAGNOSIS — E669 Obesity, unspecified: Secondary | ICD-10-CM | POA: Diagnosis not present

## 2021-08-23 DIAGNOSIS — M545 Low back pain, unspecified: Secondary | ICD-10-CM | POA: Diagnosis not present

## 2021-08-26 DIAGNOSIS — M545 Low back pain, unspecified: Secondary | ICD-10-CM | POA: Diagnosis not present

## 2021-09-21 DIAGNOSIS — E1169 Type 2 diabetes mellitus with other specified complication: Secondary | ICD-10-CM | POA: Diagnosis not present

## 2021-10-20 DIAGNOSIS — E7849 Other hyperlipidemia: Secondary | ICD-10-CM | POA: Diagnosis not present

## 2021-10-20 DIAGNOSIS — I10 Essential (primary) hypertension: Secondary | ICD-10-CM | POA: Diagnosis not present
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
## 2021-11-03 DIAGNOSIS — Z961 Presence of intraocular lens: Secondary | ICD-10-CM | POA: Diagnosis not present

## 2021-11-03 DIAGNOSIS — H524 Presbyopia: Secondary | ICD-10-CM | POA: Diagnosis not present

## 2021-11-03 DIAGNOSIS — E119 Type 2 diabetes mellitus without complications: Secondary | ICD-10-CM | POA: Diagnosis not present

## 2021-11-03 DIAGNOSIS — H109 Unspecified conjunctivitis: Secondary | ICD-10-CM | POA: Diagnosis not present

## 2021-11-03 DIAGNOSIS — H43812 Vitreous degeneration, left eye: Secondary | ICD-10-CM | POA: Diagnosis not present

## 2021-11-24 DIAGNOSIS — R21 Rash and other nonspecific skin eruption: Secondary | ICD-10-CM | POA: Diagnosis not present

## 2021-12-08 DIAGNOSIS — S86012A Strain of left Achilles tendon, initial encounter: Secondary | ICD-10-CM | POA: Diagnosis not present

## 2021-12-13 DIAGNOSIS — N10 Acute tubulo-interstitial nephritis: Secondary | ICD-10-CM | POA: Diagnosis not present

## 2021-12-13 DIAGNOSIS — R300 Dysuria: Secondary | ICD-10-CM | POA: Diagnosis not present

## 2021-12-20 DIAGNOSIS — E1140 Type 2 diabetes mellitus with diabetic neuropathy, unspecified: Secondary | ICD-10-CM | POA: Diagnosis not present

## 2021-12-20 DIAGNOSIS — I10 Essential (primary) hypertension: Secondary | ICD-10-CM | POA: Diagnosis not present

## 2021-12-20 DIAGNOSIS — E7849 Other hyperlipidemia: Secondary | ICD-10-CM | POA: Diagnosis not present

## 2021-12-22 DIAGNOSIS — R262 Difficulty in walking, not elsewhere classified: Secondary | ICD-10-CM | POA: Diagnosis not present

## 2021-12-22 DIAGNOSIS — M7662 Achilles tendinitis, left leg: Secondary | ICD-10-CM | POA: Diagnosis not present

## 2021-12-22 DIAGNOSIS — M6281 Muscle weakness (generalized): Secondary | ICD-10-CM | POA: Diagnosis not present

## 2021-12-22 DIAGNOSIS — S93422D Sprain of deltoid ligament of left ankle, subsequent encounter: Secondary | ICD-10-CM | POA: Diagnosis not present

## 2021-12-28 DIAGNOSIS — M6281 Muscle weakness (generalized): Secondary | ICD-10-CM | POA: Diagnosis not present

## 2021-12-28 DIAGNOSIS — S93422D Sprain of deltoid ligament of left ankle, subsequent encounter: Secondary | ICD-10-CM | POA: Diagnosis not present

## 2021-12-28 DIAGNOSIS — R262 Difficulty in walking, not elsewhere classified: Secondary | ICD-10-CM | POA: Diagnosis not present

## 2021-12-28 DIAGNOSIS — M7662 Achilles tendinitis, left leg: Secondary | ICD-10-CM | POA: Diagnosis not present

## 2021-12-30 DIAGNOSIS — S93422D Sprain of deltoid ligament of left ankle, subsequent encounter: Secondary | ICD-10-CM | POA: Diagnosis not present

## 2021-12-30 DIAGNOSIS — M7662 Achilles tendinitis, left leg: Secondary | ICD-10-CM | POA: Diagnosis not present

## 2021-12-30 DIAGNOSIS — R262 Difficulty in walking, not elsewhere classified: Secondary | ICD-10-CM | POA: Diagnosis not present

## 2021-12-30 DIAGNOSIS — M6281 Muscle weakness (generalized): Secondary | ICD-10-CM | POA: Diagnosis not present

## 2022-01-05 DIAGNOSIS — M7662 Achilles tendinitis, left leg: Secondary | ICD-10-CM | POA: Diagnosis not present

## 2022-01-06 DIAGNOSIS — M6281 Muscle weakness (generalized): Secondary | ICD-10-CM | POA: Diagnosis not present

## 2022-01-06 DIAGNOSIS — S93422D Sprain of deltoid ligament of left ankle, subsequent encounter: Secondary | ICD-10-CM | POA: Diagnosis not present

## 2022-01-06 DIAGNOSIS — M7662 Achilles tendinitis, left leg: Secondary | ICD-10-CM | POA: Diagnosis not present

## 2022-01-06 DIAGNOSIS — R262 Difficulty in walking, not elsewhere classified: Secondary | ICD-10-CM | POA: Diagnosis not present

## 2022-01-11 DIAGNOSIS — S93422D Sprain of deltoid ligament of left ankle, subsequent encounter: Secondary | ICD-10-CM | POA: Diagnosis not present

## 2022-01-11 DIAGNOSIS — R262 Difficulty in walking, not elsewhere classified: Secondary | ICD-10-CM | POA: Diagnosis not present

## 2022-01-11 DIAGNOSIS — M7662 Achilles tendinitis, left leg: Secondary | ICD-10-CM | POA: Diagnosis not present

## 2022-01-11 DIAGNOSIS — M6281 Muscle weakness (generalized): Secondary | ICD-10-CM | POA: Diagnosis not present

## 2022-01-13 DIAGNOSIS — R262 Difficulty in walking, not elsewhere classified: Secondary | ICD-10-CM | POA: Diagnosis not present

## 2022-01-13 DIAGNOSIS — M7662 Achilles tendinitis, left leg: Secondary | ICD-10-CM | POA: Diagnosis not present

## 2022-01-13 DIAGNOSIS — S93422D Sprain of deltoid ligament of left ankle, subsequent encounter: Secondary | ICD-10-CM | POA: Diagnosis not present

## 2022-01-13 DIAGNOSIS — M6281 Muscle weakness (generalized): Secondary | ICD-10-CM | POA: Diagnosis not present

## 2022-01-19 DIAGNOSIS — R262 Difficulty in walking, not elsewhere classified: Secondary | ICD-10-CM | POA: Diagnosis not present

## 2022-01-19 DIAGNOSIS — M7662 Achilles tendinitis, left leg: Secondary | ICD-10-CM | POA: Diagnosis not present

## 2022-01-19 DIAGNOSIS — S93422D Sprain of deltoid ligament of left ankle, subsequent encounter: Secondary | ICD-10-CM | POA: Diagnosis not present

## 2022-01-19 DIAGNOSIS — M6281 Muscle weakness (generalized): Secondary | ICD-10-CM | POA: Diagnosis not present

## 2022-01-20 DIAGNOSIS — E1140 Type 2 diabetes mellitus with diabetic neuropathy, unspecified: Secondary | ICD-10-CM | POA: Diagnosis not present

## 2022-01-20 DIAGNOSIS — E7849 Other hyperlipidemia: Secondary | ICD-10-CM | POA: Diagnosis not present

## 2022-01-20 DIAGNOSIS — I10 Essential (primary) hypertension: Secondary | ICD-10-CM | POA: Diagnosis not present

## 2022-02-02 DIAGNOSIS — M25572 Pain in left ankle and joints of left foot: Secondary | ICD-10-CM | POA: Diagnosis not present

## 2022-02-02 DIAGNOSIS — E1169 Type 2 diabetes mellitus with other specified complication: Secondary | ICD-10-CM | POA: Diagnosis not present

## 2022-02-24 DIAGNOSIS — M79605 Pain in left leg: Secondary | ICD-10-CM | POA: Diagnosis not present

## 2022-02-24 DIAGNOSIS — M79602 Pain in left arm: Secondary | ICD-10-CM | POA: Diagnosis not present

## 2022-02-24 DIAGNOSIS — M545 Low back pain, unspecified: Secondary | ICD-10-CM | POA: Diagnosis not present

## 2022-02-25 ENCOUNTER — Other Ambulatory Visit: Payer: Self-pay | Admitting: Family Medicine

## 2022-02-25 ENCOUNTER — Ambulatory Visit
Admission: RE | Admit: 2022-02-25 | Discharge: 2022-02-25 | Disposition: A | Discharge status: Home / Self Care | Payer: Medicare HMO | Source: Ambulatory Visit | Attending: Family Medicine | Admitting: Family Medicine

## 2022-02-25 DIAGNOSIS — M79605 Pain in left leg: Secondary | ICD-10-CM

## 2022-02-25 DIAGNOSIS — R52 Pain, unspecified: Secondary | ICD-10-CM

## 2022-02-25 DIAGNOSIS — M545 Low back pain, unspecified: Secondary | ICD-10-CM | POA: Diagnosis not present

## 2022-03-03 DIAGNOSIS — M545 Low back pain, unspecified: Secondary | ICD-10-CM | POA: Diagnosis not present

## 2022-03-03 DIAGNOSIS — M79605 Pain in left leg: Secondary | ICD-10-CM | POA: Diagnosis not present

## 2022-03-03 DIAGNOSIS — E1169 Type 2 diabetes mellitus with other specified complication: Secondary | ICD-10-CM | POA: Diagnosis not present

## 2022-03-16 DIAGNOSIS — E1169 Type 2 diabetes mellitus with other specified complication: Secondary | ICD-10-CM | POA: Diagnosis not present

## 2022-03-16 DIAGNOSIS — I10 Essential (primary) hypertension: Secondary | ICD-10-CM | POA: Diagnosis not present

## 2022-03-16 DIAGNOSIS — Z0000 Encounter for general adult medical examination without abnormal findings: Secondary | ICD-10-CM | POA: Diagnosis not present

## 2022-04-26 DIAGNOSIS — M130 Polyarthritis, unspecified: Secondary | ICD-10-CM | POA: Diagnosis not present

## 2022-04-26 DIAGNOSIS — E1169 Type 2 diabetes mellitus with other specified complication: Secondary | ICD-10-CM | POA: Diagnosis not present

## 2022-04-26 DIAGNOSIS — F330 Major depressive disorder, recurrent, mild: Secondary | ICD-10-CM | POA: Diagnosis not present

## 2022-05-25 DIAGNOSIS — R062 Wheezing: Secondary | ICD-10-CM | POA: Diagnosis not present

## 2022-05-25 DIAGNOSIS — J019 Acute sinusitis, unspecified: Secondary | ICD-10-CM | POA: Diagnosis not present

## 2022-06-15 DIAGNOSIS — Z1211 Encounter for screening for malignant neoplasm of colon: Secondary | ICD-10-CM | POA: Diagnosis not present

## 2022-06-15 DIAGNOSIS — Z634 Disappearance and death of family member: Secondary | ICD-10-CM | POA: Diagnosis not present

## 2022-06-15 DIAGNOSIS — Z1231 Encounter for screening mammogram for malignant neoplasm of breast: Secondary | ICD-10-CM | POA: Diagnosis not present

## 2022-06-15 DIAGNOSIS — R062 Wheezing: Secondary | ICD-10-CM | POA: Diagnosis not present

## 2022-06-15 DIAGNOSIS — Z01419 Encounter for gynecological examination (general) (routine) without abnormal findings: Secondary | ICD-10-CM | POA: Diagnosis not present

## 2022-06-15 DIAGNOSIS — N814 Uterovaginal prolapse, unspecified: Secondary | ICD-10-CM | POA: Diagnosis not present

## 2022-06-15 DIAGNOSIS — Z6831 Body mass index (BMI) 31.0-31.9, adult: Secondary | ICD-10-CM | POA: Diagnosis not present

## 2022-06-15 DIAGNOSIS — Z124 Encounter for screening for malignant neoplasm of cervix: Secondary | ICD-10-CM | POA: Diagnosis not present

## 2022-06-17 DIAGNOSIS — R0981 Nasal congestion: Secondary | ICD-10-CM | POA: Diagnosis not present

## 2022-06-17 DIAGNOSIS — R0602 Shortness of breath: Secondary | ICD-10-CM | POA: Diagnosis not present

## 2022-07-06 DIAGNOSIS — E559 Vitamin D deficiency, unspecified: Secondary | ICD-10-CM | POA: Diagnosis not present

## 2022-07-06 DIAGNOSIS — D509 Iron deficiency anemia, unspecified: Secondary | ICD-10-CM | POA: Diagnosis not present

## 2022-07-06 DIAGNOSIS — Z6833 Body mass index (BMI) 33.0-33.9, adult: Secondary | ICD-10-CM | POA: Diagnosis not present

## 2022-07-06 DIAGNOSIS — E785 Hyperlipidemia, unspecified: Secondary | ICD-10-CM | POA: Diagnosis not present

## 2022-07-06 DIAGNOSIS — I10 Essential (primary) hypertension: Secondary | ICD-10-CM | POA: Diagnosis not present

## 2022-07-06 DIAGNOSIS — I2510 Atherosclerotic heart disease of native coronary artery without angina pectoris: Secondary | ICD-10-CM | POA: Diagnosis not present

## 2022-07-06 DIAGNOSIS — E6609 Other obesity due to excess calories: Secondary | ICD-10-CM | POA: Diagnosis not present

## 2022-07-06 DIAGNOSIS — E1140 Type 2 diabetes mellitus with diabetic neuropathy, unspecified: Secondary | ICD-10-CM | POA: Diagnosis not present

## 2022-07-06 DIAGNOSIS — E782 Mixed hyperlipidemia: Secondary | ICD-10-CM | POA: Diagnosis not present

## 2022-07-27 DIAGNOSIS — D509 Iron deficiency anemia, unspecified: Secondary | ICD-10-CM | POA: Diagnosis not present

## 2022-07-27 DIAGNOSIS — F330 Major depressive disorder, recurrent, mild: Secondary | ICD-10-CM | POA: Diagnosis not present

## 2022-08-08 DIAGNOSIS — Z1212 Encounter for screening for malignant neoplasm of rectum: Secondary | ICD-10-CM | POA: Diagnosis not present

## 2022-08-08 DIAGNOSIS — Z1211 Encounter for screening for malignant neoplasm of colon: Secondary | ICD-10-CM | POA: Diagnosis not present

## 2022-08-13 LAB — COLOGUARD: COLOGUARD: NEGATIVE

## 2022-08-21 DIAGNOSIS — E7849 Other hyperlipidemia: Secondary | ICD-10-CM | POA: Diagnosis not present

## 2022-08-21 DIAGNOSIS — E1140 Type 2 diabetes mellitus with diabetic neuropathy, unspecified: Secondary | ICD-10-CM | POA: Diagnosis not present

## 2022-08-21 DIAGNOSIS — I10 Essential (primary) hypertension: Secondary | ICD-10-CM | POA: Diagnosis not present

## 2022-10-27 DIAGNOSIS — E782 Mixed hyperlipidemia: Secondary | ICD-10-CM | POA: Diagnosis not present

## 2022-10-27 DIAGNOSIS — I1310 Hypertensive heart and chronic kidney disease without heart failure, with stage 1 through stage 4 chronic kidney disease, or unspecified chronic kidney disease: Secondary | ICD-10-CM | POA: Diagnosis not present

## 2022-10-27 DIAGNOSIS — R330 Drug induced retention of urine: Secondary | ICD-10-CM | POA: Diagnosis not present

## 2022-10-27 DIAGNOSIS — M130 Polyarthritis, unspecified: Secondary | ICD-10-CM | POA: Diagnosis not present

## 2022-10-27 DIAGNOSIS — N390 Urinary tract infection, site not specified: Secondary | ICD-10-CM | POA: Diagnosis not present

## 2022-10-27 DIAGNOSIS — E1169 Type 2 diabetes mellitus with other specified complication: Secondary | ICD-10-CM | POA: Diagnosis not present

## 2022-10-27 DIAGNOSIS — Z6831 Body mass index (BMI) 31.0-31.9, adult: Secondary | ICD-10-CM | POA: Diagnosis not present

## 2022-10-27 DIAGNOSIS — I10 Essential (primary) hypertension: Secondary | ICD-10-CM | POA: Diagnosis not present

## 2022-10-27 DIAGNOSIS — K642 Third degree hemorrhoids: Secondary | ICD-10-CM | POA: Diagnosis not present

## 2022-10-27 DIAGNOSIS — I130 Hypertensive heart and chronic kidney disease with heart failure and stage 1 through stage 4 chronic kidney disease, or unspecified chronic kidney disease: Secondary | ICD-10-CM | POA: Diagnosis not present

## 2022-11-17 DIAGNOSIS — H40013 Open angle with borderline findings, low risk, bilateral: Secondary | ICD-10-CM | POA: Diagnosis not present

## 2022-11-17 DIAGNOSIS — H524 Presbyopia: Secondary | ICD-10-CM | POA: Diagnosis not present

## 2022-11-17 DIAGNOSIS — Z961 Presence of intraocular lens: Secondary | ICD-10-CM | POA: Diagnosis not present

## 2022-11-17 DIAGNOSIS — E119 Type 2 diabetes mellitus without complications: Secondary | ICD-10-CM | POA: Diagnosis not present

## 2022-11-17 DIAGNOSIS — H43812 Vitreous degeneration, left eye: Secondary | ICD-10-CM | POA: Diagnosis not present

## 2022-11-29 DIAGNOSIS — K649 Unspecified hemorrhoids: Secondary | ICD-10-CM | POA: Diagnosis not present

## 2022-11-29 DIAGNOSIS — F064 Anxiety disorder due to known physiological condition: Secondary | ICD-10-CM | POA: Diagnosis not present

## 2022-11-29 DIAGNOSIS — E559 Vitamin D deficiency, unspecified: Secondary | ICD-10-CM | POA: Diagnosis not present

## 2022-11-29 DIAGNOSIS — E1169 Type 2 diabetes mellitus with other specified complication: Secondary | ICD-10-CM | POA: Diagnosis not present

## 2022-12-16 DIAGNOSIS — U071 COVID-19: Secondary | ICD-10-CM | POA: Diagnosis not present

## 2022-12-16 DIAGNOSIS — R0981 Nasal congestion: Secondary | ICD-10-CM | POA: Diagnosis not present

## 2022-12-23 DIAGNOSIS — U071 COVID-19: Secondary | ICD-10-CM | POA: Diagnosis not present

## 2022-12-23 DIAGNOSIS — R0981 Nasal congestion: Secondary | ICD-10-CM | POA: Diagnosis not present

## 2022-12-26 DIAGNOSIS — I10 Essential (primary) hypertension: Secondary | ICD-10-CM | POA: Diagnosis not present

## 2022-12-26 DIAGNOSIS — R051 Acute cough: Secondary | ICD-10-CM | POA: Diagnosis not present

## 2022-12-26 DIAGNOSIS — Z6831 Body mass index (BMI) 31.0-31.9, adult: Secondary | ICD-10-CM | POA: Diagnosis not present

## 2022-12-27 DIAGNOSIS — K641 Second degree hemorrhoids: Secondary | ICD-10-CM | POA: Diagnosis not present

## 2023-01-05 DIAGNOSIS — U099 Post covid-19 condition, unspecified: Secondary | ICD-10-CM | POA: Diagnosis not present

## 2023-01-05 DIAGNOSIS — K649 Unspecified hemorrhoids: Secondary | ICD-10-CM | POA: Diagnosis not present

## 2023-01-05 DIAGNOSIS — K219 Gastro-esophageal reflux disease without esophagitis: Secondary | ICD-10-CM | POA: Diagnosis not present

## 2023-01-05 DIAGNOSIS — M21611 Bunion of right foot: Secondary | ICD-10-CM | POA: Diagnosis not present

## 2023-01-05 DIAGNOSIS — E1169 Type 2 diabetes mellitus with other specified complication: Secondary | ICD-10-CM | POA: Diagnosis not present

## 2023-01-31 DIAGNOSIS — E785 Hyperlipidemia, unspecified: Secondary | ICD-10-CM | POA: Diagnosis not present

## 2023-01-31 DIAGNOSIS — I10 Essential (primary) hypertension: Secondary | ICD-10-CM | POA: Diagnosis not present

## 2023-01-31 DIAGNOSIS — E1169 Type 2 diabetes mellitus with other specified complication: Secondary | ICD-10-CM | POA: Diagnosis not present

## 2023-02-21 DIAGNOSIS — K641 Second degree hemorrhoids: Secondary | ICD-10-CM | POA: Diagnosis not present

## 2023-03-31 DIAGNOSIS — E1169 Type 2 diabetes mellitus with other specified complication: Secondary | ICD-10-CM | POA: Diagnosis not present

## 2023-03-31 DIAGNOSIS — E7800 Pure hypercholesterolemia, unspecified: Secondary | ICD-10-CM | POA: Diagnosis not present

## 2023-03-31 DIAGNOSIS — I10 Essential (primary) hypertension: Secondary | ICD-10-CM | POA: Diagnosis not present

## 2023-04-14 DIAGNOSIS — Z6832 Body mass index (BMI) 32.0-32.9, adult: Secondary | ICD-10-CM | POA: Diagnosis not present

## 2023-04-14 DIAGNOSIS — E782 Mixed hyperlipidemia: Secondary | ICD-10-CM | POA: Diagnosis not present

## 2023-04-14 DIAGNOSIS — E1165 Type 2 diabetes mellitus with hyperglycemia: Secondary | ICD-10-CM | POA: Diagnosis not present

## 2023-04-14 DIAGNOSIS — I10 Essential (primary) hypertension: Secondary | ICD-10-CM | POA: Diagnosis not present

## 2023-04-14 DIAGNOSIS — L249 Irritant contact dermatitis, unspecified cause: Secondary | ICD-10-CM | POA: Diagnosis not present

## 2023-04-14 DIAGNOSIS — D509 Iron deficiency anemia, unspecified: Secondary | ICD-10-CM | POA: Diagnosis not present

## 2023-04-14 DIAGNOSIS — E559 Vitamin D deficiency, unspecified: Secondary | ICD-10-CM | POA: Diagnosis not present

## 2023-05-03 DIAGNOSIS — N390 Urinary tract infection, site not specified: Secondary | ICD-10-CM | POA: Diagnosis not present

## 2023-05-12 DIAGNOSIS — M130 Polyarthritis, unspecified: Secondary | ICD-10-CM | POA: Diagnosis not present

## 2023-05-12 DIAGNOSIS — I10 Essential (primary) hypertension: Secondary | ICD-10-CM | POA: Diagnosis not present

## 2023-05-12 DIAGNOSIS — R7303 Prediabetes: Secondary | ICD-10-CM | POA: Diagnosis not present

## 2023-05-23 DIAGNOSIS — R070 Pain in throat: Secondary | ICD-10-CM | POA: Diagnosis not present

## 2023-05-23 DIAGNOSIS — J029 Acute pharyngitis, unspecified: Secondary | ICD-10-CM | POA: Diagnosis not present

## 2023-05-29 DIAGNOSIS — J069 Acute upper respiratory infection, unspecified: Secondary | ICD-10-CM | POA: Diagnosis not present

## 2023-05-29 DIAGNOSIS — E1169 Type 2 diabetes mellitus with other specified complication: Secondary | ICD-10-CM | POA: Diagnosis not present

## 2023-05-29 DIAGNOSIS — U071 COVID-19: Secondary | ICD-10-CM | POA: Diagnosis not present

## 2023-05-30 ENCOUNTER — Other Ambulatory Visit: Payer: Self-pay | Admitting: Obstetrics and Gynecology

## 2023-05-30 DIAGNOSIS — Z1231 Encounter for screening mammogram for malignant neoplasm of breast: Secondary | ICD-10-CM

## 2023-06-21 DIAGNOSIS — E1169 Type 2 diabetes mellitus with other specified complication: Secondary | ICD-10-CM | POA: Diagnosis not present

## 2023-06-21 DIAGNOSIS — E7800 Pure hypercholesterolemia, unspecified: Secondary | ICD-10-CM | POA: Diagnosis not present

## 2023-06-21 DIAGNOSIS — I10 Essential (primary) hypertension: Secondary | ICD-10-CM | POA: Diagnosis not present

## 2023-07-04 ENCOUNTER — Ambulatory Visit
Admission: RE | Admit: 2023-07-04 | Discharge: 2023-07-04 | Disposition: A | Discharge status: Home / Self Care | Payer: Medicare HMO | Source: Ambulatory Visit | Attending: Obstetrics and Gynecology | Admitting: Obstetrics and Gynecology

## 2023-07-04 DIAGNOSIS — Z1239 Encounter for other screening for malignant neoplasm of breast: Secondary | ICD-10-CM | POA: Diagnosis not present

## 2023-07-04 DIAGNOSIS — Z124 Encounter for screening for malignant neoplasm of cervix: Secondary | ICD-10-CM | POA: Diagnosis not present

## 2023-07-04 DIAGNOSIS — Z1339 Encounter for screening examination for other mental health and behavioral disorders: Secondary | ICD-10-CM | POA: Diagnosis not present

## 2023-07-04 DIAGNOSIS — Z1231 Encounter for screening mammogram for malignant neoplasm of breast: Secondary | ICD-10-CM

## 2023-07-04 DIAGNOSIS — Z1211 Encounter for screening for malignant neoplasm of colon: Secondary | ICD-10-CM | POA: Diagnosis not present

## 2023-07-04 DIAGNOSIS — Z01419 Encounter for gynecological examination (general) (routine) without abnormal findings: Secondary | ICD-10-CM | POA: Diagnosis not present

## 2023-07-04 DIAGNOSIS — Z13820 Encounter for screening for osteoporosis: Secondary | ICD-10-CM | POA: Diagnosis not present

## 2023-07-05 ENCOUNTER — Encounter: Payer: Self-pay | Admitting: Obstetrics and Gynecology

## 2023-07-05 ENCOUNTER — Other Ambulatory Visit: Payer: Self-pay | Admitting: Obstetrics and Gynecology

## 2023-07-05 DIAGNOSIS — Z13820 Encounter for screening for osteoporosis: Secondary | ICD-10-CM

## 2023-08-19 ENCOUNTER — Ambulatory Visit
Admission: RE | Admit: 2023-08-19 | Discharge: 2023-08-19 | Disposition: A | Discharge status: Home / Self Care | Payer: Medicare HMO | Source: Ambulatory Visit | Attending: Obstetrics and Gynecology | Admitting: Obstetrics and Gynecology

## 2023-08-19 DIAGNOSIS — E2839 Other primary ovarian failure: Secondary | ICD-10-CM | POA: Diagnosis not present

## 2023-08-19 DIAGNOSIS — N958 Other specified menopausal and perimenopausal disorders: Secondary | ICD-10-CM | POA: Diagnosis not present

## 2023-08-19 DIAGNOSIS — Z13820 Encounter for screening for osteoporosis: Secondary | ICD-10-CM

## 2023-09-15 ENCOUNTER — Telehealth: Payer: Self-pay

## 2023-09-15 DIAGNOSIS — N390 Urinary tract infection, site not specified: Secondary | ICD-10-CM | POA: Diagnosis not present

## 2023-09-15 DIAGNOSIS — R399 Unspecified symptoms and signs involving the genitourinary system: Secondary | ICD-10-CM | POA: Diagnosis not present

## 2023-09-15 NOTE — Progress Notes (Addendum)
   09/15/2023  Patient ID: Maureen Bishop, female   DOB: [birth year], 73 y.o.   MRN: [id number]  Contacted patient regarding medication adherence from a quality report for [facility]. The patient failed MAD in 2024.    The patient was getting Janumet XR through a patient assistance program in 2023, I will assist patient in re-enrolling in PAP for Janumet XR.   Appointment scheduled 09/18/23 at 1:00 pm.  Livia Riffle, PharmD Clinical Pharmacist  [phone]

## 2023-09-18 NOTE — Progress Notes (Signed)
   09/18/2023  Patient ID: Maureen Bishop, female   DOB: 1951-03-05, 73 y.o.   MRN: [id number]  Contacted patient regarding medication adherence from a quality report for [facility]. The patient failed MAD in 2024.    The patient was getting Janumet XR through a patient assistance program in 2023, I will assist patient in re-enrolling in PAP for Janumet XR.   Attempted to contact patient for scheduled appointment for medication management. Left HIPAA compliant message for patient to return my call at their convenience.   Livia Riffle, PharmD Clinical Pharmacist  [phone]

## 2023-09-21 NOTE — Progress Notes (Signed)
   09/21/2023  Patient ID: Maureen Bishop, female   DOB: 1950-09-09, 73 y.o.   MRN: [id number]  Contacted patient regarding medication adherence from a quality report for [facility]. The patient failed MAD in 2024.    The patient was getting Janumet XR through a patient assistance program in 2023, I will assist patient in re-enrolling in PAP for Janumet XR.   Attempted to contact patient for scheduled appointment for medication management. Left HIPAA compliant message for patient to return my call at their convenience.   Livia Riffle, PharmD Clinical Pharmacist  [phone]

## 2023-09-29 NOTE — Progress Notes (Signed)
   09/29/2023  Patient ID: Maureen Bishop, female   DOB: [birth year], 73 y.o.   MRN: [id number]  Contacted patient regarding medication adherence from a quality report for [facility]. The patient failed MAD in 2024.    The patient was getting Janumet XR through a patient assistance program in 2023, I will assist patient in re-enrolling in PAP for Janumet XR.   Was able to speak to patient today, she reports she will bring the forms to the clinic next week for us  to move forward in the PAP application process.  Livia Riffle, PharmD Clinical Pharmacist  [phone]

## 2023-10-20 NOTE — Progress Notes (Signed)
   10/20/2023  Patient ID: Maureen Bishop, female   DOB: [birth year], 74 y.o.   MRN: [id number]  Contacted patient regarding medication adherence from a quality report for [facility]. The patient failed MAD in 2024.    Spoke to patient today, she declines to move forward with Janumet XR PAP at this time. She is enjoying Onglyza and it is covered by her insurance. She does not report any financial barriers to medications at this time.   Will continue to monitor patient for medication or disease state management needs  Livia Riffle, PharmD Clinical Pharmacist  [phone]

## 2023-10-25 DIAGNOSIS — R350 Frequency of micturition: Secondary | ICD-10-CM | POA: Diagnosis not present

## 2023-10-30 DIAGNOSIS — E1169 Type 2 diabetes mellitus with other specified complication: Secondary | ICD-10-CM | POA: Diagnosis not present

## 2023-10-30 DIAGNOSIS — R635 Abnormal weight gain: Secondary | ICD-10-CM | POA: Diagnosis not present

## 2023-10-30 DIAGNOSIS — F411 Generalized anxiety disorder: Secondary | ICD-10-CM | POA: Diagnosis not present

## 2023-10-30 DIAGNOSIS — I10 Essential (primary) hypertension: Secondary | ICD-10-CM | POA: Diagnosis not present

## 2023-10-30 DIAGNOSIS — M940 Chondrocostal junction syndrome [Tietze]: Secondary | ICD-10-CM | POA: Diagnosis not present

## 2023-10-30 DIAGNOSIS — M545 Low back pain, unspecified: Secondary | ICD-10-CM | POA: Diagnosis not present

## 2023-10-30 DIAGNOSIS — Z6832 Body mass index (BMI) 32.0-32.9, adult: Secondary | ICD-10-CM | POA: Diagnosis not present

## 2023-11-01 ENCOUNTER — Other Ambulatory Visit: Payer: Self-pay | Admitting: Family Medicine

## 2023-11-01 ENCOUNTER — Ambulatory Visit
Admission: RE | Admit: 2023-11-01 | Discharge: 2023-11-01 | Disposition: A | Discharge status: Home / Self Care | Source: Ambulatory Visit | Attending: Family Medicine | Admitting: Family Medicine

## 2023-11-01 DIAGNOSIS — M545 Low back pain, unspecified: Secondary | ICD-10-CM

## 2023-11-03 DIAGNOSIS — M545 Low back pain, unspecified: Secondary | ICD-10-CM | POA: Diagnosis not present

## 2023-11-03 DIAGNOSIS — I10 Essential (primary) hypertension: Secondary | ICD-10-CM | POA: Diagnosis not present

## 2023-11-03 DIAGNOSIS — F411 Generalized anxiety disorder: Secondary | ICD-10-CM | POA: Diagnosis not present

## 2023-11-03 DIAGNOSIS — R7303 Prediabetes: Secondary | ICD-10-CM | POA: Diagnosis not present

## 2024-01-19 DIAGNOSIS — M545 Low back pain, unspecified: Secondary | ICD-10-CM | POA: Diagnosis not present

## 2024-01-19 DIAGNOSIS — E1165 Type 2 diabetes mellitus with hyperglycemia: Secondary | ICD-10-CM | POA: Diagnosis not present

## 2024-01-19 DIAGNOSIS — R202 Paresthesia of skin: Secondary | ICD-10-CM | POA: Diagnosis not present

## 2024-01-19 DIAGNOSIS — K219 Gastro-esophageal reflux disease without esophagitis: Secondary | ICD-10-CM | POA: Diagnosis not present

## 2024-01-19 DIAGNOSIS — R1012 Left upper quadrant pain: Secondary | ICD-10-CM | POA: Diagnosis not present

## 2024-01-19 DIAGNOSIS — E790 Hyperuricemia without signs of inflammatory arthritis and tophaceous disease: Secondary | ICD-10-CM | POA: Diagnosis not present

## 2024-01-19 DIAGNOSIS — D509 Iron deficiency anemia, unspecified: Secondary | ICD-10-CM | POA: Diagnosis not present

## 2024-01-19 DIAGNOSIS — Z6832 Body mass index (BMI) 32.0-32.9, adult: Secondary | ICD-10-CM | POA: Diagnosis not present

## 2024-01-23 ENCOUNTER — Other Ambulatory Visit: Payer: Self-pay | Admitting: Family Medicine

## 2024-01-23 DIAGNOSIS — R109 Unspecified abdominal pain: Secondary | ICD-10-CM

## 2024-01-24 ENCOUNTER — Ambulatory Visit
Admission: RE | Admit: 2024-01-24 | Discharge: 2024-01-24 | Disposition: A | Discharge status: Home / Self Care | Source: Ambulatory Visit | Attending: Family Medicine | Admitting: Family Medicine

## 2024-01-24 DIAGNOSIS — R109 Unspecified abdominal pain: Secondary | ICD-10-CM

## 2024-02-05 DIAGNOSIS — I10 Essential (primary) hypertension: Secondary | ICD-10-CM | POA: Diagnosis not present

## 2024-02-05 DIAGNOSIS — F330 Major depressive disorder, recurrent, mild: Secondary | ICD-10-CM | POA: Diagnosis not present

## 2024-02-05 DIAGNOSIS — E782 Mixed hyperlipidemia: Secondary | ICD-10-CM | POA: Diagnosis not present

## 2024-02-05 DIAGNOSIS — E1165 Type 2 diabetes mellitus with hyperglycemia: Secondary | ICD-10-CM | POA: Diagnosis not present

## 2024-03-06 DIAGNOSIS — J9801 Acute bronchospasm: Secondary | ICD-10-CM | POA: Diagnosis not present

## 2024-03-06 DIAGNOSIS — J399 Disease of upper respiratory tract, unspecified: Secondary | ICD-10-CM | POA: Diagnosis not present

## 2024-03-19 DIAGNOSIS — F324 Major depressive disorder, single episode, in partial remission: Secondary | ICD-10-CM | POA: Diagnosis not present

## 2024-03-19 DIAGNOSIS — E785 Hyperlipidemia, unspecified: Secondary | ICD-10-CM | POA: Diagnosis not present

## 2024-03-19 DIAGNOSIS — E876 Hypokalemia: Secondary | ICD-10-CM | POA: Diagnosis not present

## 2024-03-19 DIAGNOSIS — I509 Heart failure, unspecified: Secondary | ICD-10-CM | POA: Diagnosis not present

## 2024-03-19 DIAGNOSIS — Z823 Family history of stroke: Secondary | ICD-10-CM | POA: Diagnosis not present

## 2024-03-19 DIAGNOSIS — E669 Obesity, unspecified: Secondary | ICD-10-CM | POA: Diagnosis not present

## 2024-03-19 DIAGNOSIS — N959 Unspecified menopausal and perimenopausal disorder: Secondary | ICD-10-CM | POA: Diagnosis not present

## 2024-03-19 DIAGNOSIS — Z7902 Long term (current) use of antithrombotics/antiplatelets: Secondary | ICD-10-CM | POA: Diagnosis not present

## 2024-03-19 DIAGNOSIS — N814 Uterovaginal prolapse, unspecified: Secondary | ICD-10-CM | POA: Diagnosis not present

## 2024-03-19 DIAGNOSIS — R32 Unspecified urinary incontinence: Secondary | ICD-10-CM | POA: Diagnosis not present

## 2024-03-19 DIAGNOSIS — I130 Hypertensive heart and chronic kidney disease with heart failure and stage 1 through stage 4 chronic kidney disease, or unspecified chronic kidney disease: Secondary | ICD-10-CM | POA: Diagnosis not present

## 2024-03-19 DIAGNOSIS — K219 Gastro-esophageal reflux disease without esophagitis: Secondary | ICD-10-CM | POA: Diagnosis not present

## 2024-03-19 DIAGNOSIS — Z882 Allergy status to sulfonamides status: Secondary | ICD-10-CM | POA: Diagnosis not present

## 2024-03-19 DIAGNOSIS — E1140 Type 2 diabetes mellitus with diabetic neuropathy, unspecified: Secondary | ICD-10-CM | POA: Diagnosis not present

## 2024-03-19 DIAGNOSIS — Z7984 Long term (current) use of oral hypoglycemic drugs: Secondary | ICD-10-CM | POA: Diagnosis not present

## 2024-03-19 DIAGNOSIS — E1122 Type 2 diabetes mellitus with diabetic chronic kidney disease: Secondary | ICD-10-CM | POA: Diagnosis not present

## 2024-03-19 DIAGNOSIS — I2510 Atherosclerotic heart disease of native coronary artery without angina pectoris: Secondary | ICD-10-CM | POA: Diagnosis not present

## 2024-03-19 DIAGNOSIS — F411 Generalized anxiety disorder: Secondary | ICD-10-CM | POA: Diagnosis not present

## 2024-03-19 DIAGNOSIS — N189 Chronic kidney disease, unspecified: Secondary | ICD-10-CM | POA: Diagnosis not present

## 2024-04-21 ENCOUNTER — Emergency Department (HOSPITAL_BASED_OUTPATIENT_CLINIC_OR_DEPARTMENT_OTHER)

## 2024-04-21 ENCOUNTER — Emergency Department (HOSPITAL_BASED_OUTPATIENT_CLINIC_OR_DEPARTMENT_OTHER)
Admission: EM | Admit: 2024-04-21 | Discharge: 2024-04-21 | Disposition: A | Discharge status: Home / Self Care | Attending: Emergency Medicine | Admitting: Emergency Medicine

## 2024-04-21 DIAGNOSIS — I10 Essential (primary) hypertension: Secondary | ICD-10-CM | POA: Insufficient documentation

## 2024-04-21 DIAGNOSIS — R519 Headache, unspecified: Secondary | ICD-10-CM | POA: Insufficient documentation

## 2024-04-21 DIAGNOSIS — W01198A Fall on same level from slipping, tripping and stumbling with subsequent striking against other object, initial encounter: Secondary | ICD-10-CM | POA: Diagnosis not present

## 2024-04-21 DIAGNOSIS — E119 Type 2 diabetes mellitus without complications: Secondary | ICD-10-CM | POA: Diagnosis not present

## 2024-04-21 DIAGNOSIS — Z7901 Long term (current) use of anticoagulants: Secondary | ICD-10-CM | POA: Insufficient documentation

## 2024-04-21 DIAGNOSIS — I2510 Atherosclerotic heart disease of native coronary artery without angina pectoris: Secondary | ICD-10-CM | POA: Diagnosis not present

## 2024-04-21 DIAGNOSIS — W19XXXA Unspecified fall, initial encounter: Secondary | ICD-10-CM

## 2024-04-21 DIAGNOSIS — S199XXA Unspecified injury of neck, initial encounter: Secondary | ICD-10-CM | POA: Diagnosis not present

## 2024-04-21 DIAGNOSIS — Z79899 Other long term (current) drug therapy: Secondary | ICD-10-CM | POA: Diagnosis not present

## 2024-04-21 DIAGNOSIS — S0990XA Unspecified injury of head, initial encounter: Secondary | ICD-10-CM | POA: Diagnosis not present

## 2024-04-21 NOTE — ED Provider Notes (Signed)
 [facility] AT [facility] Provider Note   CSN: [id number] Arrival date & time: 04/21/24  1303     Patient presents with: Fall (Plavix )   Maureen Bishop is a 73 y.o. female.   Patient is a 73 year old female with a past medical history of hypertension, diabetes, CAD on Plavix  presenting to the emergency department after a fall.  The patient states on Thursday she was sitting on a bench and did not realize that there was not a backrest on her bench.  She states that she leaned back and fell backwards and hit the back of her head.  She denied any loss of consciousness.  She states that she initially felt fine which is why she waited to come in to be seen.  She states that she spoke with some friends this weekend who recommended she be evaluated.  States that she does have some pain when she presses on the back of her head but has not noticed any bleeding or lumps.  She denies any neck pain.  She denies any headaches, nausea, vomiting, numbness or weakness.  The history is provided by the patient.  Fall       Prior to Admission medications   Medication Sig Start Date End Date Taking? Authorizing Provider  acetaminophen  (TYLENOL ) 500 MG tablet Take 1,000 mg by mouth at bedtime.     [provider]  amLODipine  (NORVASC ) 2.5 MG tablet Take 1 tablet (2.5 mg total) by mouth at bedtime for 30 days. 09/13/18 10/13/18  Madie Jon Garre, PA  atorvastatin  (LIPITOR ) 80 MG tablet TAKE 1 TABLET (80 MG TOTAL) BY MOUTH DAILY AT 6 (SIX) AM. 03/16/20   Maranda Leim DEL, MD  Cholecalciferol (VITAMIN D3 PO) Take 1,000 Units by mouth daily.    [provider]  clopidogrel  (PLAVIX ) 75 MG tablet Take 75 mg by mouth daily.    [provider]  famotidine  (PEPCID ) 20 MG tablet Take 1 tablet (20 mg total) by mouth 2 (two) times daily. 04/21/19   Doretha Folks, MD  Ferrous Sulfate  (IRON  PO) Take 1 tablet by mouth daily.    [provider]   Iron , Ferrous Sulfate , 325 (65 Fe) MG TABS Take 325 mg by mouth 2 (two) times daily. Take for 2 months only. 02/08/19   Maranda Leim DEL, MD  metoprolol  tartrate (LOPRESSOR ) 25 MG tablet Take 1 tablet (25 mg total) by mouth 2 (two) times daily for 30 days. 09/13/18 10/13/18  D[facility], Jon Garre, PA  nitroGLYCERIN  (NITROSTAT ) 0.4 MG SL tablet Place 1 tablet (0.4 mg total) under the tongue every 5 (five) minutes as needed for chest pain. 09/07/18   Amin, Ankit C, MD  pregabalin  (LYRICA ) 50 MG capsule Take 50-100 mg by mouth See admin instructions. Take 50 mg by mouth in the morning and 100 mg at bedtime    [provider]  SitaGLIPtin-MetFORMIN HCl (JANUMET XR) [phone] MG TB24 Take 1 tablet by mouth at bedtime.    [provider]  sucralfate  (CARAFATE ) 1 GM/10ML suspension Take 10 mLs (1 g total) by mouth 4 (four) times daily -  with meals and at bedtime. 04/23/20   LampteyAleene KIDD, MD  vitamin B-12 (CYANOCOBALAMIN) 1000 MCG tablet Take 1,000 mcg by mouth daily.    [provider]    Allergies: Sulfa antibiotics and Sulfasalazine    Review of Systems  Updated Vital Signs BP (!) 147/67   Pulse (!) 58   Temp 97.8 F (36.6 C) (Oral)  Resp 20   Ht 4' 11 (1.499 m)   Wt 73.5 kg   SpO2 100%   BMI 32.72 kg/m   Physical Exam Vitals and nursing note reviewed.  Constitutional:      General: She is not in acute distress.    Appearance: Normal appearance.  HENT:     Head: Normocephalic and atraumatic.     Nose: Nose normal.  Eyes:     Extraocular Movements: Extraocular movements intact.     Conjunctiva/sclera: Conjunctivae normal.  Cardiovascular:     Rate and Rhythm: Normal rate and regular rhythm.     Heart sounds: Normal heart sounds.  Pulmonary:     Effort: Pulmonary effort is normal.     Breath sounds: Normal breath sounds.  Abdominal:     General: Abdomen is flat.     Palpations: Abdomen is soft.     Tenderness: There is no abdominal tenderness.   Musculoskeletal:        General: Normal range of motion.     Cervical back: Normal range of motion.     Comments: No midline neck or back tenderness No bony tenderness of bilateral upper or lower extremities  Skin:    General: Skin is warm and dry.  Neurological:     General: No focal deficit present.     Mental Status: She is alert and oriented to person, place, and time.  Psychiatric:        Mood and Affect: Mood normal.        Behavior: Behavior normal.     (all labs ordered are listed, but only abnormal results are displayed) Labs Reviewed - No data to display  EKG: None  Radiology: CT Head Wo Contrast Result Date: 04/21/2024 CLINICAL DATA:  Clemens 3 days ago with trauma to the head and neck. EXAM: CT HEAD WITHOUT CONTRAST CT CERVICAL SPINE WITHOUT CONTRAST TECHNIQUE: Multidetector CT imaging of the head and cervical spine was performed following the standard protocol without intravenous contrast. Multiplanar CT image reconstructions of the cervical spine were also generated. RADIATION DOSE REDUCTION: This exam was performed according to the departmental dose-optimization program which includes automated exposure control, adjustment of the mA and/or kV according to patient size and/or use of iterative reconstruction technique. COMPARISON:  MRI 11/16/2019 FINDINGS: CT HEAD FINDINGS Brain: The brain shows a normal appearance without evidence of malformation, atrophy, old or acute small or large vessel infarction, mass lesion, hemorrhage, hydrocephalus or extra-axial collection. Vascular: No hyperdense vessel. No evidence of atherosclerotic calcification. Skull: Normal.  No traumatic finding.  No focal bone lesion. Sinuses/Orbits: Sinuses are clear. Orbits appear normal. Mastoids are clear. Other: None significant CT CERVICAL SPINE FINDINGS Alignment: Normal Skull base and vertebrae: No regional fracture. Chronic fusion of the facet joints at C4-5. Soft tissues and spinal canal: No  traumatic finding. Disc levels: Disc levels are unremarkable except for endplate osteophytes at the C6-7 level. Chronic facet fusion at C4-5 as noted above. Facet arthropathy at the other levels throughout the cervical region which could be a cause of neck pain. Bony foraminal narrowing which could possibly be symptomatic on the left at C3-4, on the right at C4-5, on the right at C5-6, on the left at C6-7. Upper chest: Negative Other: None IMPRESSION: HEAD CT: Normal. CERVICAL SPINE CT: No acute or traumatic finding. Chronic facet fusion at C4-5. Facet arthropathy at the other levels throughout the cervical region which could be a cause of neck pain. Bony foraminal narrowing which could possibly be  symptomatic on the left at C3-4, on the right at C4-5, on the right at C5-6, on the left at C6-7. Electronically Signed   By: Oneil Officer M.D.   On: 04/21/2024 14:47   CT Cervical Spine Wo Contrast Result Date: 04/21/2024 CLINICAL DATA:  Clemens 3 days ago with trauma to the head and neck. EXAM: CT HEAD WITHOUT CONTRAST CT CERVICAL SPINE WITHOUT CONTRAST TECHNIQUE: Multidetector CT imaging of the head and cervical spine was performed following the standard protocol without intravenous contrast. Multiplanar CT image reconstructions of the cervical spine were also generated. RADIATION DOSE REDUCTION: This exam was performed according to the departmental dose-optimization program which includes automated exposure control, adjustment of the mA and/or kV according to patient size and/or use of iterative reconstruction technique. COMPARISON:  MRI 11/16/2019 FINDINGS: CT HEAD FINDINGS Brain: The brain shows a normal appearance without evidence of malformation, atrophy, old or acute small or large vessel infarction, mass lesion, hemorrhage, hydrocephalus or extra-axial collection. Vascular: No hyperdense vessel. No evidence of atherosclerotic calcification. Skull: Normal.  No traumatic finding.  No focal bone lesion.  Sinuses/Orbits: Sinuses are clear. Orbits appear normal. Mastoids are clear. Other: None significant CT CERVICAL SPINE FINDINGS Alignment: Normal Skull base and vertebrae: No regional fracture. Chronic fusion of the facet joints at C4-5. Soft tissues and spinal canal: No traumatic finding. Disc levels: Disc levels are unremarkable except for endplate osteophytes at the C6-7 level. Chronic facet fusion at C4-5 as noted above. Facet arthropathy at the other levels throughout the cervical region which could be a cause of neck pain. Bony foraminal narrowing which could possibly be symptomatic on the left at C3-4, on the right at C4-5, on the right at C5-6, on the left at C6-7. Upper chest: Negative Other: None IMPRESSION: HEAD CT: Normal. CERVICAL SPINE CT: No acute or traumatic finding. Chronic facet fusion at C4-5. Facet arthropathy at the other levels throughout the cervical region which could be a cause of neck pain. Bony foraminal narrowing which could possibly be symptomatic on the left at C3-4, on the right at C4-5, on the right at C5-6, on the left at C6-7. Electronically Signed   By: Oneil Officer M.D.   On: 04/21/2024 14:47     Procedures   Medications Ordered in the ED - No data to display  Clinical Course as of 04/21/24 1513  Sun Apr 21, 2024  1450 No acute traumatic injury on CT imaging. Patient is stable for discharge home with outpatient follow up. [VK]    Clinical Course User Index [VK] Kingsley, Kadija Cruzen K, DO                                 Medical Decision Making This patient presents to the ED with chief complaint(s) of fall with pertinent past medical history of hypertension, diabetes, CAD on Plavix  which further complicates the presenting complaint. The complaint involves an extensive differential diagnosis and also carries with it a high risk of complications and morbidity.    The differential diagnosis includes ICH, mass effect, cervical spine fracture, contusion, skull  fracture, no other traumatic injury seen on exam, no presyncopal symptoms making syncopal fall unlikely  Additional history obtained: Additional history obtained from N/A Records reviewed N/A  ED Course and Reassessment: On patient's arrival she is hemodynamically stable in no acute distress.  Due to patient's age and head injury will have head CT and C-spine performed.  She  declined any pain control at this time.  She will be closely reassessed.  Independent labs interpretation:  N/A  Independent visualization of imaging: - I independently visualized the following imaging with scope of interpretation limited to determining acute life threatening conditions related to emergency care: CT head/C-spine, which revealed no acute traumatic injury  Consultation: - Consulted or discussed management/test interpretation w/ external professional: N/A  Consideration for admission or further workup: Patient has no emergent conditions requiring admission or further work-up at this time and is stable for discharge home with primary care follow-up  Social Determinants of health: N/A    Amount and/or Complexity of Data Reviewed Radiology: ordered.       Final diagnoses:  Fall, initial encounter    ED Discharge Orders     None          Ellouise Richerd POUR, [place] 04/21/24 1514

## 2024-04-21 NOTE — Discharge Instructions (Signed)
 You were seen in the emergency department after your fall.  Your workup showed no broken bones or internal bleeding.  You likely just bruised the back of your head.  You can take Tylenol  as needed for pain or ice to the back of your head.  You should follow-up with your primary doctor to make sure that your symptoms improve.  You should return to the emergency department for significantly worsening headache, repetitive vomiting, numbness or weakness in your arms or legs, seizures or any other new or concerning symptoms.

## 2024-04-21 NOTE — ED Triage Notes (Signed)
 Pt reports fall on Thursday, falling backwards and hitting posterior head on some shelving. Pt is on Plavix . Denies lightheadedness, vision changes, headache, neck pain. Reports pain to palpation.

## 2024-06-07 ENCOUNTER — Encounter: Payer: Self-pay | Admitting: Family Medicine

## 2024-06-07 ENCOUNTER — Other Ambulatory Visit: Payer: Self-pay | Admitting: Family Medicine

## 2024-06-07 DIAGNOSIS — R109 Unspecified abdominal pain: Secondary | ICD-10-CM

## 2024-06-19 ENCOUNTER — Other Ambulatory Visit: Payer: Self-pay | Admitting: Family Medicine

## 2024-06-19 DIAGNOSIS — R197 Diarrhea, unspecified: Secondary | ICD-10-CM

## 2024-06-19 DIAGNOSIS — R1030 Lower abdominal pain, unspecified: Secondary | ICD-10-CM

## 2024-06-20 ENCOUNTER — Encounter: Payer: Self-pay | Admitting: Family Medicine

## 2024-06-21 ENCOUNTER — Encounter: Payer: Self-pay | Admitting: Family Medicine

## 2024-06-28 ENCOUNTER — Inpatient Hospital Stay: Admission: RE | Admit: 2024-06-28

## 2024-07-09 ENCOUNTER — Other Ambulatory Visit
